# Patient Record
Sex: Male | Born: 1955 | Race: White | Hispanic: No | Marital: Married | Smoking: Former smoker
Health system: Southern US, Community
[De-identification: ages and names within clinical notes are randomized; demographics above are authoritative.]

## PROBLEM LIST (undated history)

## (undated) DIAGNOSIS — C801 Malignant (primary) neoplasm, unspecified: Secondary | ICD-10-CM

## (undated) DIAGNOSIS — H9190 Unspecified hearing loss, unspecified ear: Secondary | ICD-10-CM

## (undated) DIAGNOSIS — R7303 Prediabetes: Secondary | ICD-10-CM

## (undated) DIAGNOSIS — M169 Osteoarthritis of hip, unspecified: Secondary | ICD-10-CM

## (undated) DIAGNOSIS — M109 Gout, unspecified: Secondary | ICD-10-CM

## (undated) HISTORY — DX: Malignant (primary) neoplasm, unspecified: C80.1

## (undated) HISTORY — DX: Unspecified hearing loss, unspecified ear: H91.90

## (undated) HISTORY — DX: Gout, unspecified: M10.9

## (undated) HISTORY — PX: INNER EAR SURGERY: SHX679

## (undated) HISTORY — PX: CATARACT EXTRACTION: SUR2

---

## 2004-02-08 ENCOUNTER — Ambulatory Visit (HOSPITAL_COMMUNITY): Admission: RE | Admit: 2004-02-08 | Discharge: 2004-02-08 | Payer: Self-pay | Admitting: Orthopedic Surgery

## 2005-06-30 HISTORY — PX: OTHER SURGICAL HISTORY: SHX169

## 2006-08-03 IMAGING — CR DG ORBITS FOR FOREIGN BODY
2 series · 2 of 2 positions shown · non-contrast
Comparison: none

CLINICAL DATA: foreign body
 ORBITS FOR FOREIGN BODY (TWO VIEWS)
 There is no evidence of metal or radiopaque foreign body within the orbits. No significant bone or soft tissue abnormalities are identified.

 IMPRESSION
 No evidence of metallic foreign body.

[view not recorded (1 of 2)]
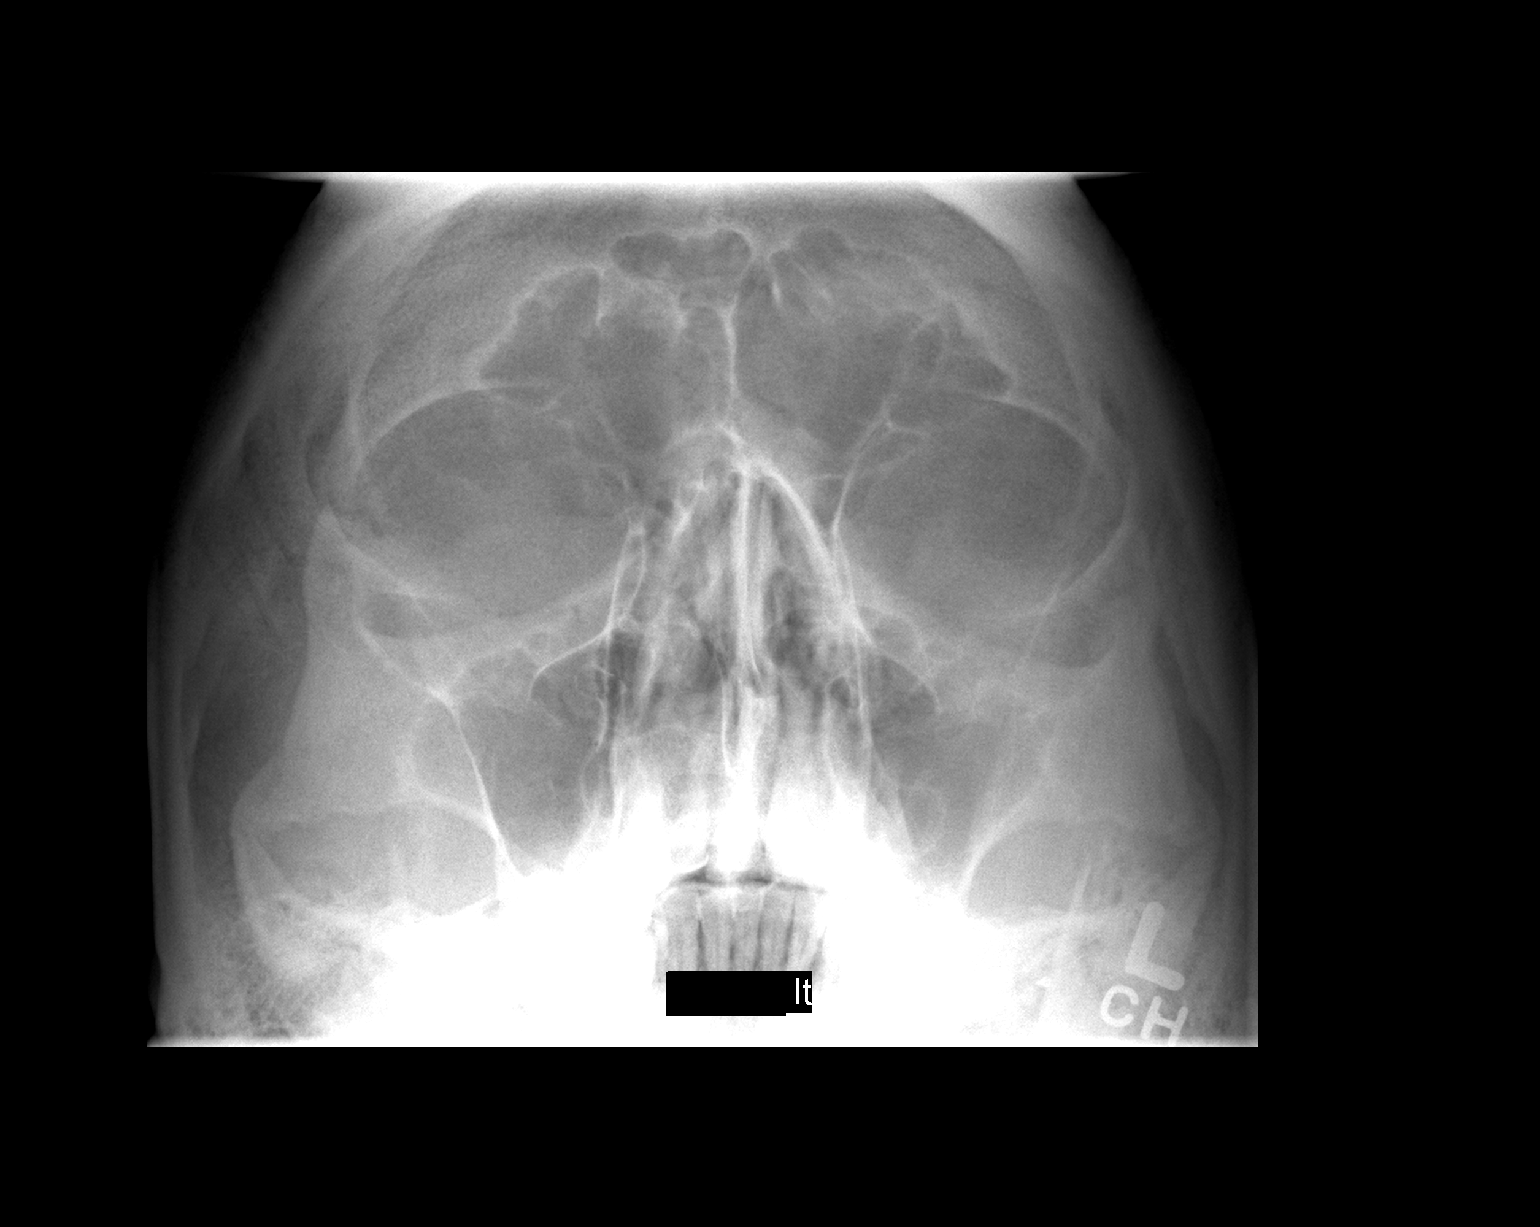

[view not recorded (2 of 2)]
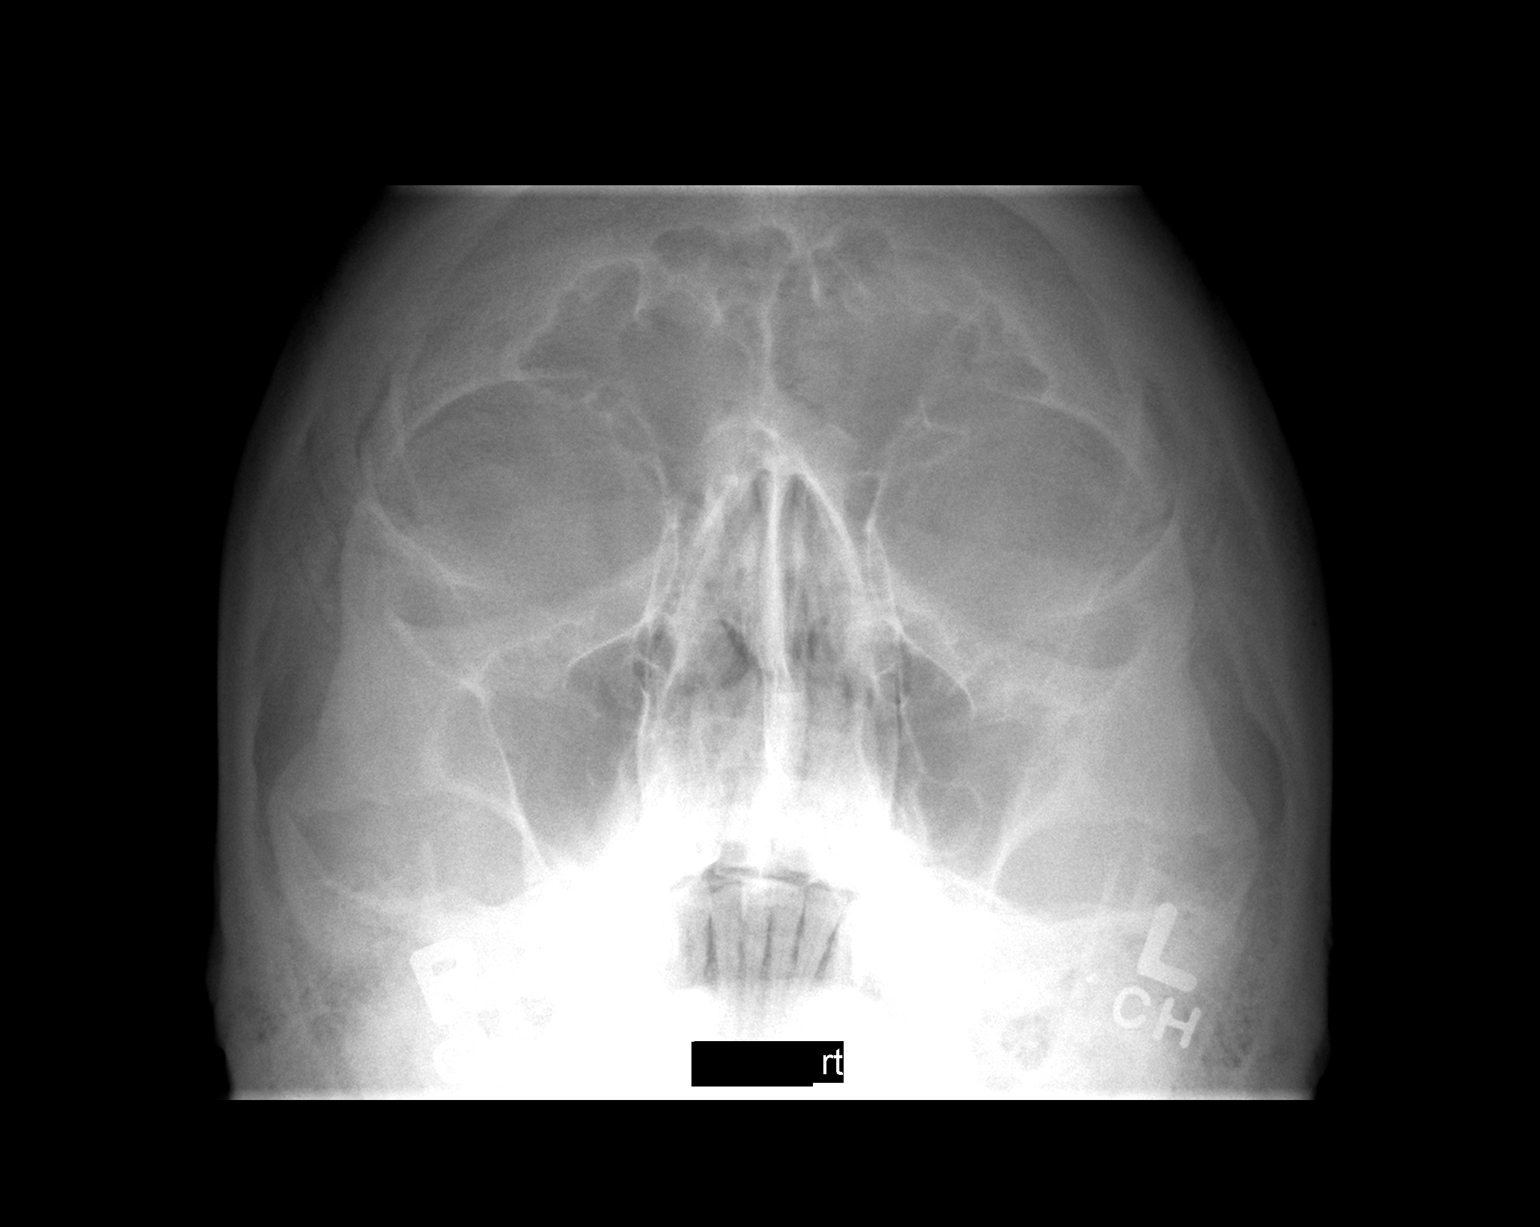

[2 of 2 positions shown; findings below may reference images not displayed]

## 2012-10-13 ENCOUNTER — Ambulatory Visit (INDEPENDENT_AMBULATORY_CARE_PROVIDER_SITE_OTHER): Payer: BC Managed Care – PPO | Admitting: *Deleted

## 2012-10-13 ENCOUNTER — Encounter: Payer: Self-pay | Admitting: *Deleted

## 2012-10-13 ENCOUNTER — Encounter: Payer: Self-pay | Admitting: Diagnostic Neuroimaging

## 2012-10-13 DIAGNOSIS — R200 Anesthesia of skin: Secondary | ICD-10-CM

## 2012-10-13 DIAGNOSIS — E538 Deficiency of other specified B group vitamins: Secondary | ICD-10-CM

## 2012-10-13 MED ORDER — CYANOCOBALAMIN 1000 MCG/ML IJ SOLN
1000.0000 ug | INTRAMUSCULAR | Status: AC
  Administered 2012-10-13 – 2013-03-02 (×5): 1000 ug via INTRAMUSCULAR

## 2012-10-13 NOTE — Progress Notes (Signed)
Pt here for B 12 injection.  Under aseptic technique cyanocobalamin 1000mcg/1ml IM given L deltoid.  Tolerated well.  Bandaid applied.  

## 2012-10-13 NOTE — Patient Instructions (Signed)
See pt next month. (5/14-14) .

## 2012-11-10 ENCOUNTER — Ambulatory Visit: Payer: BC Managed Care – PPO

## 2012-12-08 ENCOUNTER — Ambulatory Visit (INDEPENDENT_AMBULATORY_CARE_PROVIDER_SITE_OTHER): Payer: BC Managed Care – PPO | Admitting: *Deleted

## 2012-12-08 DIAGNOSIS — E538 Deficiency of other specified B group vitamins: Secondary | ICD-10-CM

## 2012-12-08 NOTE — Progress Notes (Signed)
Pt here for B 12 injection.  Under aseptic technique cyanocobalamin 1000mcg/1ml IM given L deltoid.  Tolerated well.  Bandaid applied.  

## 2012-12-08 NOTE — Patient Instructions (Signed)
Pt to return 01-05-13

## 2013-01-05 ENCOUNTER — Ambulatory Visit (INDEPENDENT_AMBULATORY_CARE_PROVIDER_SITE_OTHER): Payer: BC Managed Care – PPO | Admitting: *Deleted

## 2013-01-05 DIAGNOSIS — E538 Deficiency of other specified B group vitamins: Secondary | ICD-10-CM

## 2013-01-05 NOTE — Progress Notes (Signed)
Pt here for B 12 injection.  Under aseptic technique cyanocobalamin 1000mcg/1ml IM given L deltoid.  Tolerated well.  Bandaid applied.  

## 2013-01-05 NOTE — Patient Instructions (Addendum)
Will see next week 

## 2013-02-02 ENCOUNTER — Ambulatory Visit (INDEPENDENT_AMBULATORY_CARE_PROVIDER_SITE_OTHER): Payer: BC Managed Care – PPO

## 2013-02-02 DIAGNOSIS — E538 Deficiency of other specified B group vitamins: Secondary | ICD-10-CM

## 2013-02-02 NOTE — Patient Instructions (Signed)
Return in one month.  

## 2013-02-02 NOTE — Progress Notes (Signed)
Administration of Cyanocobalamin 1065mcg/1ml injection under aseptic technique in R deltoid. Tolerated well.

## 2013-03-02 ENCOUNTER — Ambulatory Visit (INDEPENDENT_AMBULATORY_CARE_PROVIDER_SITE_OTHER): Payer: BC Managed Care – PPO | Admitting: *Deleted

## 2013-03-02 DIAGNOSIS — E538 Deficiency of other specified B group vitamins: Secondary | ICD-10-CM

## 2013-03-02 NOTE — Patient Instructions (Signed)
See next month. 

## 2013-03-02 NOTE — Progress Notes (Signed)
Pt here for B 12 injection.  Under aseptic technique cyanocobalamin 1000mcg/1ml IM given R deltoid.  Tolerated well.  Bandaid applied.  

## 2013-03-30 ENCOUNTER — Ambulatory Visit (INDEPENDENT_AMBULATORY_CARE_PROVIDER_SITE_OTHER): Payer: BC Managed Care – PPO | Admitting: Neurology

## 2013-03-30 DIAGNOSIS — E538 Deficiency of other specified B group vitamins: Secondary | ICD-10-CM

## 2013-03-30 MED ORDER — CYANOCOBALAMIN 1000 MCG/ML IJ SOLN
1000.0000 ug | Freq: Once | INTRAMUSCULAR | Status: AC
  Administered 2013-03-30: 1000 ug via INTRAMUSCULAR

## 2013-03-30 NOTE — Patient Instructions (Signed)
Patient will return next month for B12 injection. 

## 2013-03-30 NOTE — Progress Notes (Signed)
Patient here for B12 injection.  Under aseptic technique Cyanocobalamin 1000mcg/1ml given IM in left deltoid.  Tolerated well.  Band-Aid applied. 

## 2013-04-01 ENCOUNTER — Ambulatory Visit: Payer: BC Managed Care – PPO

## 2013-04-27 ENCOUNTER — Ambulatory Visit (INDEPENDENT_AMBULATORY_CARE_PROVIDER_SITE_OTHER): Payer: BC Managed Care – PPO | Admitting: *Deleted

## 2013-04-27 DIAGNOSIS — E538 Deficiency of other specified B group vitamins: Secondary | ICD-10-CM

## 2013-04-27 MED ORDER — CYANOCOBALAMIN 1000 MCG/ML IJ SOLN
1000.0000 ug | INTRAMUSCULAR | Status: AC
  Administered 2013-04-27 – 2013-09-07 (×6): 1000 ug via INTRAMUSCULAR

## 2013-04-27 NOTE — Progress Notes (Signed)
Pt here for B 12 injection.  Under aseptic technique cyanocobalamin 1000mcg/1ml IM given R deltoid.  Tolerated well.  Bandaid applied.  

## 2013-04-27 NOTE — Patient Instructions (Signed)
See next week

## 2013-05-02 ENCOUNTER — Other Ambulatory Visit: Payer: Self-pay | Admitting: Diagnostic Neuroimaging

## 2013-05-06 ENCOUNTER — Other Ambulatory Visit: Payer: Self-pay

## 2013-05-06 MED ORDER — GABAPENTIN 300 MG PO CAPS: 300.0000 mg | ORAL_CAPSULE | Freq: Three times a day (TID) | ORAL | Status: DC

## 2013-05-25 ENCOUNTER — Ambulatory Visit (INDEPENDENT_AMBULATORY_CARE_PROVIDER_SITE_OTHER): Payer: BC Managed Care – PPO | Admitting: *Deleted

## 2013-05-25 DIAGNOSIS — E538 Deficiency of other specified B group vitamins: Secondary | ICD-10-CM

## 2013-05-25 NOTE — Progress Notes (Signed)
Pt here for B 12 injection.  Under aseptic technique cyanocobalamin 1000mcg/1ml IM given R deltoid.  Tolerated well.  Bandaid applied.  

## 2013-05-25 NOTE — Patient Instructions (Signed)
See next month.   Has appt with Dr. Marjory Lies next Monday.

## 2013-05-30 ENCOUNTER — Encounter: Payer: Self-pay | Admitting: Diagnostic Neuroimaging

## 2013-05-30 ENCOUNTER — Ambulatory Visit (INDEPENDENT_AMBULATORY_CARE_PROVIDER_SITE_OTHER): Payer: BC Managed Care – PPO | Admitting: Diagnostic Neuroimaging

## 2013-05-30 VITALS — BP 126/74 | HR 60 | Ht 77.0 in | Wt 258.0 lb

## 2013-05-30 DIAGNOSIS — E538 Deficiency of other specified B group vitamins: Secondary | ICD-10-CM

## 2013-05-30 DIAGNOSIS — G63 Polyneuropathy in diseases classified elsewhere: Secondary | ICD-10-CM

## 2013-05-30 MED ORDER — GABAPENTIN 300 MG PO CAPS: 300.0000 mg | ORAL_CAPSULE | Freq: Two times a day (BID) | ORAL | Status: DC

## 2013-05-30 NOTE — Addendum Note (Signed)
Addended byJoycelyn Schmid on: 05/30/2013 09:25 AM   Modules accepted: Orders

## 2013-05-30 NOTE — Patient Instructions (Signed)
Continue gabapentin and B12 replacement injections.

## 2013-05-30 NOTE — Progress Notes (Signed)
[facility]  PATIENT: Stephen Rodgers DOB: 1955/07/02  REFERRING CLINICIAN:  HISTORY FROM: patient REASON FOR VISIT: follow up   HISTORICAL  CHIEF COMPLAINT:  Chief Complaint  Patient presents with  . Follow-up    numbness-feet    HISTORY OF PRESENT ILLNESS:   UPDATE 05/30/13: Since last visit patient is doing well. He feels that B12 injections are helping his numbness in his feet. He also takes gabapentin 300 mg twice a day. Patient reports missing some vitamin B12 injections (when he was out of town, do to the death of both of his brothers), and at that time his numbness increased.  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 He has also had left ear surgery attempted for repair in 1999 with a metal pin placed. He is here today to be evaluated for numbness to the balls and toes of both feet for the last 6-8 months both started at the same time. He describes discomfort as prickly It is present at least 90% of the time worse in the mornings when he wakens and at night after working. He states it feels like they are asleep. Reports balance issues upon awakening takes him about our 10 seconds before he takes a step. 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 He has had a back injury when he was 57 years old which needs realignment every now and then. Denies any sharp shooting pains from his back. He reports he eats a lot of her any. He is supposed to take vitamin B12 tablets for a vitamin B12 level of 224 done on 10/13/11 which he has not started. Fasting blood sugar was 113 on 10/13/11. He primarily just wants to know why he is having numbness.  REVIEW OF SYSTEMS: Full 14 system review of systems performed and notable only for hearing loss, numbness. Otherwise  negative.  ALLERGIES: No Known Allergies  HOME MEDICATIONS: Outpatient Prescriptions Prior to Visit  Medication Sig Dispense Refill  . allopurinol (ZYLOPRIM) 300 MG tablet Take 300 mg by mouth daily.      . cyanocobalamin (,VITAMIN B-12,) 1000 MCG/ML injection Inject 1,000 mcg into the muscle every 30 (thirty) days.      Marland Kitchen ibuprofen (ADVIL,MOTRIN) 800 MG tablet Take 800 mg by mouth every 8 (eight) hours as needed for pain.      Marland Kitchen gabapentin (NEURONTIN) 300 MG capsule TAKE 1 CAPSULE BY MOUTH EVERY DAY AT BEDTIME INCREASE TO 1 CAPSULE BY MOUTH THREE TIMES DAILY AS TOLERATED  90 capsule  3   Facility-Administered Medications Prior to Visit  Medication Dose Route Frequency Provider Last Rate Last Dose  . cyanocobalamin ((VITAMIN B-12)) injection 1,000 mcg  1,000 mcg Intramuscular Q30 days Suanne Marker, MD   1,000 mcg at 05/25/13 0840    PAST MEDICAL HISTORY: Past Medical History  Diagnosis Date  . Hearing loss   . Gout   . Cancer     PAST SURGICAL HISTORY: Past Surgical History  Procedure Laterality Date  . Other surgical history Left 2007    L eye cancer and reconstruct    FAMILY HISTORY: Family History  Problem Relation Age of Onset  . Hypertension Mother     Died in MVA  17  . Other Mother     Biomedical scientist  . Colon  cancer Father     PD, Triple bypass, died 46  . Stomach cancer Maternal Grandmother   . Lung cancer Maternal Grandfather   . COPD Brother     Died Nov 13, 2012    SOCIAL HISTORY:  History   Social History  . Marital Status: Married    Spouse Name: Tamie    Number of Children: 3  . Years of Education: College   Occupational History  .      [employer]   Social History Main Topics  . Smoking status: Former Smoker    Quit date: 11/13/2003  . Smokeless tobacco: Current User    Types: Chew  . Alcohol Use: 0.0 oz/week     Comment: beer occasionally  . Drug Use: No  . Sexual Activity: Not on file   Other Topics Concern  . Not on file    Social History Narrative   Patient lives at home with spouse.   Caffeine Use: 2 cups daily     PHYSICAL EXAM  Filed Vitals:   05/30/13 0843  BP: 126/74  Pulse: 60  Height: 6\' 5"  (1.956 m)  Weight: 258 lb (117.028 kg)    Not recorded    Body mass index is 30.59 kg/(m^2).  GENERAL EXAM: Patient is in no distress; LEFT LOWER EYELID POST-SURGICAL.  CARDIOVASCULAR: Regular rate and rhythm, no murmurs, no carotid bruits  NEUROLOGIC: MENTAL STATUS: awake, alert, language fluent, comprehension intact, naming intact CRANIAL NERVE: pupils equal and reactive to light, visual fields full to confrontation, extraocular muscles intact, no nystagmus, facial sensation and strength symmetric, uvula midline, shoulder shrug symmetric, tongue midline. MOTOR: normal bulk and tone, full strength in the BUE, BLE SENSORY: DECR IN FEET TO PP, LT, TEMP. ABSENT VIB AT TOES. COORDINATION: finger-nose-finger, fine finger movements normal REFLEXES: deep tendon reflexes present and symmetric; TRACE AT ANKLES. GAIT/STATION: narrow based gait; able to walk on toes, heels and tandem; romberg is negative   DIAGNOSTIC DATA (LABS, IMAGING, TESTING) - I reviewed patient records, labs, notes, testing and imaging myself where available.  No results found for this basename: WBC, HGB, HCT, MCV, PLT   No results found for this basename: na, k, cl, co2, glucose, bun, creatinine, calcium, prot, albumin, ast, alt, alkphos, bilitot, gfrnonaa, gfraa   No results found for this basename: CHOL, HDL, LDLCALC, LDLDIRECT, TRIG, CHOLHDL   No results found for this basename: HGBA1C   No results found for this basename: VITAMINB12   No results found for this basename: TSH   10/11/11 Labs B12 224  10/25/11 Labs A1C 5.2 TSH 2.080 Anti-parietal - 5.9 Anti-IF - POSITIVE Homocysteine 10.6 MMA 135  11/04/11 EMG/NCS 1. Widespread, length-dependent axonal sensorimotor polyneuropathy. 2. 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   ASSESSMENT AND PLAN  57 y.o. 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 Patient is doing well on B12 injection replacement therapy and gabapentin.  PLAN: - continue monthly B12 injections - continue gabapentin 300mg  BID   Meds ordered this encounter  Medications  . gabapentin (NEURONTIN) 300 MG capsule    Sig: Take 1 capsule (300 mg total) by mouth 2 (two) times daily.    Dispense:  180 capsule    Refill:  4    Pt states takes bid   Return in about 1 year (around 05/30/2014) for with Edison Nasuti, MD 05/30/2013, 9:08 AM Certified in Neurology, Neurophysiology and Neuroimaging  [facility]  [address] [place], [place] [address] [phone]

## 2013-06-21 ENCOUNTER — Ambulatory Visit (INDEPENDENT_AMBULATORY_CARE_PROVIDER_SITE_OTHER): Payer: BC Managed Care – PPO

## 2013-06-21 DIAGNOSIS — E538 Deficiency of other specified B group vitamins: Secondary | ICD-10-CM

## 2013-06-21 NOTE — Progress Notes (Signed)
Cyanocobalamin (B 12) 1000 mcg injected in left deltoid under aseptic technique. Tolerated well.

## 2013-06-21 NOTE — Patient Instructions (Signed)
Return in one month.  

## 2013-07-19 ENCOUNTER — Encounter (INDEPENDENT_AMBULATORY_CARE_PROVIDER_SITE_OTHER): Payer: Self-pay

## 2013-07-19 ENCOUNTER — Ambulatory Visit (INDEPENDENT_AMBULATORY_CARE_PROVIDER_SITE_OTHER): Payer: BC Managed Care – PPO | Admitting: *Deleted

## 2013-07-19 DIAGNOSIS — E538 Deficiency of other specified B group vitamins: Secondary | ICD-10-CM

## 2013-07-19 DIAGNOSIS — G63 Polyneuropathy in diseases classified elsewhere: Secondary | ICD-10-CM

## 2013-07-19 NOTE — Progress Notes (Signed)
Pt here for B 12 injection.  Under aseptic technique cyanocobalamin 1000mcg/1ml IM given R deltoid.  Tolerated well.  Bandaid applied.  

## 2013-07-19 NOTE — Patient Instructions (Signed)
See next month (3 wks per pt since his work schedule).

## 2013-08-10 ENCOUNTER — Ambulatory Visit (INDEPENDENT_AMBULATORY_CARE_PROVIDER_SITE_OTHER): Payer: BC Managed Care – PPO | Admitting: *Deleted

## 2013-08-10 DIAGNOSIS — Z0289 Encounter for other administrative examinations: Secondary | ICD-10-CM

## 2013-08-10 DIAGNOSIS — E538 Deficiency of other specified B group vitamins: Secondary | ICD-10-CM

## 2013-08-10 DIAGNOSIS — G63 Polyneuropathy in diseases classified elsewhere: Secondary | ICD-10-CM

## 2013-08-10 NOTE — Patient Instructions (Signed)
See next month. 

## 2013-08-10 NOTE — Progress Notes (Signed)
Pt here for B 12 injection.  Under aseptic technique cyanocobalamin 1000mcg/1ml IM given R deltoid.  Tolerated well.  Bandaid applied.  

## 2013-09-07 ENCOUNTER — Ambulatory Visit (INDEPENDENT_AMBULATORY_CARE_PROVIDER_SITE_OTHER): Payer: BC Managed Care – PPO | Admitting: *Deleted

## 2013-09-07 DIAGNOSIS — E538 Deficiency of other specified B group vitamins: Secondary | ICD-10-CM

## 2013-09-07 DIAGNOSIS — G63 Polyneuropathy in diseases classified elsewhere: Principal | ICD-10-CM

## 2013-09-07 NOTE — Patient Instructions (Signed)
Will see next month.

## 2013-09-07 NOTE — Progress Notes (Signed)
Pt here for B 12 injection.  Under aseptic technique cyanocobalamin 1000mcg/1ml IM given R deltoid.  Tolerated well.  Bandaid applied.  

## 2013-10-05 ENCOUNTER — Ambulatory Visit (INDEPENDENT_AMBULATORY_CARE_PROVIDER_SITE_OTHER): Payer: BC Managed Care – PPO | Admitting: *Deleted

## 2013-10-05 DIAGNOSIS — E538 Deficiency of other specified B group vitamins: Secondary | ICD-10-CM

## 2013-10-05 MED ORDER — CYANOCOBALAMIN 1000 MCG/ML IJ SOLN
1000.0000 ug | INTRAMUSCULAR | Status: AC
  Administered 2013-10-05 – 2014-01-25 (×5): 1000 ug via INTRAMUSCULAR

## 2013-10-05 NOTE — Progress Notes (Signed)
Pt here for B 12 injection.  Under aseptic technique cyanocobalamin 1000mcg/1ml IM given R deltoid.  Tolerated well.  Bandaid applied.  

## 2013-10-05 NOTE — Patient Instructions (Signed)
See next month. 

## 2013-11-02 ENCOUNTER — Ambulatory Visit (INDEPENDENT_AMBULATORY_CARE_PROVIDER_SITE_OTHER): Payer: BC Managed Care – PPO | Admitting: *Deleted

## 2013-11-02 DIAGNOSIS — G63 Polyneuropathy in diseases classified elsewhere: Secondary | ICD-10-CM

## 2013-11-02 DIAGNOSIS — E538 Deficiency of other specified B group vitamins: Secondary | ICD-10-CM

## 2013-11-02 NOTE — Progress Notes (Signed)
Pt here for B 12 injection.  Under aseptic technique cyanocobalamin 1000mcg/1ml IM given R deltoid.  Tolerated well.  Bandaid applied.  

## 2013-11-02 NOTE — Patient Instructions (Signed)
See next month. 

## 2013-11-30 ENCOUNTER — Ambulatory Visit (INDEPENDENT_AMBULATORY_CARE_PROVIDER_SITE_OTHER): Payer: BC Managed Care – PPO | Admitting: *Deleted

## 2013-11-30 DIAGNOSIS — E538 Deficiency of other specified B group vitamins: Secondary | ICD-10-CM

## 2013-11-30 NOTE — Patient Instructions (Addendum)
See pt next week for # 4 of weekly injections.

## 2013-11-30 NOTE — Progress Notes (Signed)
Pt here for B 12 injection.  Under aseptic technique cyanocobalamin 1000mcg/1ml IM given R deltoid.  Tolerated well.  Bandaid applied.  

## 2013-12-28 ENCOUNTER — Ambulatory Visit (INDEPENDENT_AMBULATORY_CARE_PROVIDER_SITE_OTHER): Payer: BC Managed Care – PPO | Admitting: *Deleted

## 2013-12-28 DIAGNOSIS — E538 Deficiency of other specified B group vitamins: Secondary | ICD-10-CM

## 2013-12-28 DIAGNOSIS — G63 Polyneuropathy in diseases classified elsewhere: Principal | ICD-10-CM

## 2013-12-28 NOTE — Progress Notes (Signed)
Pt here for B 12 injection.  Under aseptic technique cyanocobalamin 1000mcg/1ml IM given R deltoid.  Tolerated well.  Bandaid applied.  

## 2013-12-28 NOTE — Patient Instructions (Signed)
See next month. 

## 2014-01-25 ENCOUNTER — Ambulatory Visit (INDEPENDENT_AMBULATORY_CARE_PROVIDER_SITE_OTHER): Payer: BC Managed Care – PPO

## 2014-01-25 DIAGNOSIS — G63 Polyneuropathy in diseases classified elsewhere: Principal | ICD-10-CM

## 2014-01-25 DIAGNOSIS — E538 Deficiency of other specified B group vitamins: Secondary | ICD-10-CM

## 2014-01-25 NOTE — Patient Instructions (Signed)
See patient next month.

## 2014-01-25 NOTE — Progress Notes (Signed)
Pt here for B 12 injection. Under aseptic technique cyanocobalamin 1000mcg/1ml IM given L deltoid. Tolerated well. 

## 2014-02-22 ENCOUNTER — Ambulatory Visit (INDEPENDENT_AMBULATORY_CARE_PROVIDER_SITE_OTHER): Payer: BC Managed Care – PPO | Admitting: *Deleted

## 2014-02-22 DIAGNOSIS — E538 Deficiency of other specified B group vitamins: Secondary | ICD-10-CM

## 2014-02-22 MED ORDER — CYANOCOBALAMIN 1000 MCG/ML IJ SOLN: 1000.0000 ug | INTRAMUSCULAR | Status: DC

## 2014-02-22 MED ORDER — CYANOCOBALAMIN 1000 MCG/ML IJ SOLN
1000.0000 ug | INTRAMUSCULAR | Status: AC
  Administered 2014-02-22 – 2014-06-14 (×5): 1000 ug via INTRAMUSCULAR

## 2014-02-22 NOTE — Progress Notes (Signed)
Patient here for B 12 injection, cyanocobalamin 1000 mcg/ mL injected into the Left deltoid, band aid applied, patient tolerated well.

## 2014-02-22 NOTE — Patient Instructions (Signed)
Patient will return in one month for next injection. 

## 2014-03-22 ENCOUNTER — Ambulatory Visit (INDEPENDENT_AMBULATORY_CARE_PROVIDER_SITE_OTHER): Payer: BC Managed Care – PPO

## 2014-03-22 DIAGNOSIS — E538 Deficiency of other specified B group vitamins: Secondary | ICD-10-CM

## 2014-03-22 NOTE — Progress Notes (Signed)
Patient here for B 12 injection, cyanocobalamin 1000 mcg/ mL injected into the R deltoid, band aid applied, patient tolerated well.

## 2014-03-22 NOTE — Patient Instructions (Signed)
Return in 4 weeks

## 2014-04-19 ENCOUNTER — Ambulatory Visit (INDEPENDENT_AMBULATORY_CARE_PROVIDER_SITE_OTHER): Payer: BC Managed Care – PPO | Admitting: *Deleted

## 2014-04-19 DIAGNOSIS — G63 Polyneuropathy in diseases classified elsewhere: Principal | ICD-10-CM

## 2014-04-19 DIAGNOSIS — E538 Deficiency of other specified B group vitamins: Secondary | ICD-10-CM

## 2014-04-19 NOTE — Patient Instructions (Signed)
Patient will return in one month for next injection. 

## 2014-04-19 NOTE — Progress Notes (Signed)
Patient here for B 12 injection, cyanocobalamin 1000 mcg/ mL injected into the R deltoid, band aid applied, patient tolerated well.

## 2014-05-10 ENCOUNTER — Telehealth: Payer: Self-pay | Admitting: Diagnostic Neuroimaging

## 2014-05-10 NOTE — Telephone Encounter (Signed)
Left message for patient regarding rescheduling 05/30/14 per [place] leaving.

## 2014-05-17 ENCOUNTER — Ambulatory Visit (INDEPENDENT_AMBULATORY_CARE_PROVIDER_SITE_OTHER): Payer: BC Managed Care – PPO | Admitting: *Deleted

## 2014-05-17 DIAGNOSIS — E538 Deficiency of other specified B group vitamins: Secondary | ICD-10-CM

## 2014-05-17 DIAGNOSIS — G63 Polyneuropathy in diseases classified elsewhere: Principal | ICD-10-CM

## 2014-05-17 NOTE — Patient Instructions (Signed)
See next month for injection.  See's Dr. Leta Baptist 06-01-14.

## 2014-05-17 NOTE — Progress Notes (Signed)
Pt here for B 12 injection.  Under aseptic technique cyanocobalamin 1000mcg/1ml IM given L deltoid.  Tolerated well.  Bandaid applied.  

## 2014-05-30 ENCOUNTER — Ambulatory Visit: Payer: BC Managed Care – PPO | Admitting: Nurse Practitioner

## 2014-06-01 ENCOUNTER — Ambulatory Visit: Payer: BC Managed Care – PPO | Admitting: Diagnostic Neuroimaging

## 2014-06-02 ENCOUNTER — Ambulatory Visit (INDEPENDENT_AMBULATORY_CARE_PROVIDER_SITE_OTHER): Payer: BC Managed Care – PPO | Admitting: Diagnostic Neuroimaging

## 2014-06-02 ENCOUNTER — Encounter: Payer: Self-pay | Admitting: Diagnostic Neuroimaging

## 2014-06-02 VITALS — BP 112/71 | HR 60 | Temp 97.5°F | Ht 77.0 in | Wt 266.0 lb

## 2014-06-02 DIAGNOSIS — E538 Deficiency of other specified B group vitamins: Secondary | ICD-10-CM

## 2014-06-02 DIAGNOSIS — G63 Polyneuropathy in diseases classified elsewhere: Principal | ICD-10-CM

## 2014-06-02 NOTE — Addendum Note (Signed)
Addended by: Milta Deiters on: 06/02/2014 10:16 AM   Modules accepted: Medications

## 2014-06-02 NOTE — Progress Notes (Signed)
[facility]  PATIENT: Stephen Rodgers DOB: 11/28/1955  REFERRING CLINICIAN:  HISTORY FROM: patient REASON FOR VISIT: follow up   HISTORICAL  CHIEF COMPLAINT:  No chief complaint on file.   HISTORY OF PRESENT ILLNESS:   UPDATE 06/02/14: Since last visit, sxs are stable. On B12 injections. Workign 14hrs per day x 3 months, and some more plantar foot pain, diff than prior neuropathy numbness.  UPDATE 05/30/13: Since last visit patient is doing well. He feels that B12 injections are helping his numbness in his feet. He also takes gabapentin 300 mg twice a day. Patient reports missing some vitamin B12 injections (when he was out of town, do to the death of both of his brothers), and at that time his numbness increased.  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 He has also had left ear surgery attempted for repair in 1999 with a metal pin placed. He is here today to be evaluated for numbness to the balls and toes of both feet for the last 6-8 months both started at the same time. He describes discomfort as prickly It is present at least 90% of the time worse in the mornings when he wakens and at night after working. He states it feels like they are asleep. Reports balance issues upon awakening takes him about our 10 seconds before he takes a step. 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 He has had a back injury when he was 58 years old which needs realignment every now and then. Denies any sharp shooting pains from his back. He reports he eats a lot of her any. He is supposed to take vitamin B12 tablets for a vitamin B12 level of 224 done on 10/13/11 which he has not started. Fasting blood sugar was 113 on 10/13/11. He primarily just wants to know why he is having  numbness.  REVIEW OF SYSTEMS: Full 14 system review of systems performed and notable only for ear discharge.   ALLERGIES: No Known Allergies  HOME MEDICATIONS: Outpatient Prescriptions Prior to Visit  Medication Sig Dispense Refill  . allopurinol (ZYLOPRIM) 300 MG tablet Take 300 mg by mouth daily.    Marland Kitchen gabapentin (NEURONTIN) 300 MG capsule Take 1 capsule (300 mg total) by mouth 2 (two) times daily. 180 capsule 4  . ibuprofen (ADVIL,MOTRIN) 800 MG tablet Take 800 mg by mouth every 8 (eight) hours as needed for pain.     Facility-Administered Medications Prior to Visit  Medication Dose Route Frequency Provider Last Rate Last Dose  . cyanocobalamin ((VITAMIN B-12)) injection 1,000 mcg  1,000 mcg Intramuscular Q30 days Penni Bombard, MD   1,000 mcg at 05/17/14 7035    PAST MEDICAL HISTORY: Past Medical History  Diagnosis Date  . Hearing loss   . Gout   . Cancer     PAST SURGICAL HISTORY: Past Surgical History  Procedure Laterality Date  . Other surgical history Left 2007    L eye cancer and reconstruct    FAMILY HISTORY: Family History  Problem Relation Age of Onset  . Hypertension Mother     Died in [place]  . Other Mother     Facilities manager  . Colon cancer Father     PD, Triple bypass, died 58  . Stomach cancer Maternal Grandmother   .  Lung cancer Maternal Grandfather   . COPD Brother     Died 2014    SOCIAL HISTORY:  History   Social History  . Marital Status: Married    Spouse Name: Tamie    Number of Children: 3  . Years of Education: College   Occupational History  .      [employer]   Social History Main Topics  . Smoking status: Former Smoker    Quit date: 11/13/2003  . Smokeless tobacco: Current User    Types: Chew  . Alcohol Use: 0.0 oz/week     Comment: beer occasionally  . Drug Use: No  . Sexual Activity: Not on file   Other Topics Concern  . Not on file   Social History Narrative   Patient lives at home with spouse.    Caffeine Use: 2 cups daily     PHYSICAL EXAM  There were no vitals filed for this visit.  Not recorded      There is no weight on file to calculate BMI.  GENERAL EXAM: Patient is in no distress; LEFT LOWER EYELID POST-SURGICAL.  CARDIOVASCULAR: Regular rate and rhythm, no murmurs, no carotid bruits  NEUROLOGIC: MENTAL STATUS: awake, alert, language fluent, comprehension intact, naming intact CRANIAL NERVE: pupils equal and reactive to light, visual fields full to confrontation, extraocular muscles intact, no nystagmus, facial sensation and strength symmetric, uvula midline, shoulder shrug symmetric, tongue midline. MOTOR: normal bulk and tone, full strength in the BUE, BLE SENSORY: DECR IN FEET TO PP, LT, TEMP. ABSENT VIB AT TOES. COORDINATION: finger-nose-finger, fine finger movements normal REFLEXES: deep tendon reflexes present and symmetric; TRACE AT ANKLES. GAIT/STATION: narrow based gait; able to walk on toes, heels; romberg is negative   DIAGNOSTIC DATA (LABS, IMAGING, TESTING) - I reviewed patient records, labs, notes, testing and imaging myself where available.  No results found for: WBC No results found for: NA No results found for: CHOL No results found for: HGBA1C No results found for: VITAMINB12 No results found for: TSH    10/25/11 A1C 5.2, TSH 2.080  10/11/11 B12 - 224 (L)  10/25/11 Anti-parietal - 5.9, Anti-IF - POSITIVE, Homocysteine 10.6, MMA 135  11/04/11 EMG/NCS 1. Widespread, length-dependent axonal sensorimotor polyneuropathy. 2. Evidence of chronic denervation in the right gastrocnemius muscle.   ASSESSMENT AND PLAN  58 y.o. 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 Patient is stable on B12 injection replacement therapy and gabapentin.  PLAN: - continue monthly B12 injections; will check B12 level today - continue gabapentin 300mg  BID  Return in about 6 months (around  12/02/2014).   Penni Bombard, MD 95/11/3873, 6:43 AM Certified in Neurology, Neurophysiology and Neuroimaging  [facility] [address] [place], [place] [address] [phone]

## 2014-06-03 LAB — VITAMIN B12: VITAMIN B 12: 483 pg/mL (ref 211–946)

## 2014-06-09 ENCOUNTER — Other Ambulatory Visit: Payer: Self-pay | Admitting: Diagnostic Neuroimaging

## 2014-06-14 ENCOUNTER — Ambulatory Visit (INDEPENDENT_AMBULATORY_CARE_PROVIDER_SITE_OTHER): Payer: BC Managed Care – PPO | Admitting: *Deleted

## 2014-06-14 DIAGNOSIS — G63 Polyneuropathy in diseases classified elsewhere: Principal | ICD-10-CM

## 2014-06-14 DIAGNOSIS — E538 Deficiency of other specified B group vitamins: Secondary | ICD-10-CM

## 2014-06-14 NOTE — Patient Instructions (Signed)
Patient will return in 4 weeks for next injection.

## 2014-06-14 NOTE — Progress Notes (Signed)
Patient here for B 12 injection. Under aseptic technique cyanocobalamin 1066mcg/1ml IM given right deltoid , patient tolerated well. Bandaid applied.

## 2014-07-12 ENCOUNTER — Ambulatory Visit (INDEPENDENT_AMBULATORY_CARE_PROVIDER_SITE_OTHER): Payer: BLUE CROSS/BLUE SHIELD | Admitting: Neurology

## 2014-07-12 DIAGNOSIS — E538 Deficiency of other specified B group vitamins: Secondary | ICD-10-CM

## 2014-07-12 MED ORDER — CYANOCOBALAMIN 1000 MCG/ML IJ SOLN
1000.0000 ug | Freq: Once | INTRAMUSCULAR | Status: AC
  Administered 2014-07-12: 1000 ug via INTRAMUSCULAR

## 2014-07-12 NOTE — Progress Notes (Signed)
Patient here for B12 injection.  Under aseptic technique Cyanocobalamin 1074mcg/1ml given IM in left deltoid.  Tolerated well.  Band-Aid applied.

## 2014-08-09 ENCOUNTER — Ambulatory Visit (INDEPENDENT_AMBULATORY_CARE_PROVIDER_SITE_OTHER): Payer: BLUE CROSS/BLUE SHIELD | Admitting: *Deleted

## 2014-08-09 DIAGNOSIS — E538 Deficiency of other specified B group vitamins: Secondary | ICD-10-CM

## 2014-08-09 MED ORDER — CYANOCOBALAMIN 1000 MCG/ML IJ SOLN
1000.0000 ug | INTRAMUSCULAR | Status: DC
  Administered 2014-08-09 – 2015-02-21 (×5): 1000 ug via INTRAMUSCULAR

## 2014-08-09 NOTE — Progress Notes (Signed)
Patient here for B12 injection. Under aseptic technique Cyanocobalamin 1073mcg/1ml given IM in Right deltoid.Patient tolerated well, Band-Aid applied

## 2014-08-09 NOTE — Patient Instructions (Signed)
Patient will return in one month for next injection. 

## 2014-11-01 ENCOUNTER — Ambulatory Visit (INDEPENDENT_AMBULATORY_CARE_PROVIDER_SITE_OTHER): Payer: BLUE CROSS/BLUE SHIELD | Admitting: *Deleted

## 2014-11-01 DIAGNOSIS — E538 Deficiency of other specified B group vitamins: Secondary | ICD-10-CM | POA: Diagnosis not present

## 2014-11-01 NOTE — Patient Instructions (Signed)
Pt was instructed that he would receive a 1 ml injection of Vit B12 into his left deltoid.

## 2014-11-29 ENCOUNTER — Ambulatory Visit (INDEPENDENT_AMBULATORY_CARE_PROVIDER_SITE_OTHER): Payer: BLUE CROSS/BLUE SHIELD

## 2014-11-29 DIAGNOSIS — E538 Deficiency of other specified B group vitamins: Secondary | ICD-10-CM

## 2014-12-06 ENCOUNTER — Other Ambulatory Visit: Payer: Self-pay | Admitting: Diagnostic Neuroimaging

## 2014-12-27 ENCOUNTER — Ambulatory Visit (INDEPENDENT_AMBULATORY_CARE_PROVIDER_SITE_OTHER): Payer: BLUE CROSS/BLUE SHIELD | Admitting: *Deleted

## 2014-12-27 DIAGNOSIS — E538 Deficiency of other specified B group vitamins: Secondary | ICD-10-CM

## 2015-01-24 ENCOUNTER — Ambulatory Visit (INDEPENDENT_AMBULATORY_CARE_PROVIDER_SITE_OTHER): Payer: BLUE CROSS/BLUE SHIELD | Admitting: *Deleted

## 2015-01-24 ENCOUNTER — Other Ambulatory Visit: Payer: Self-pay | Admitting: *Deleted

## 2015-01-24 DIAGNOSIS — E538 Deficiency of other specified B group vitamins: Secondary | ICD-10-CM

## 2015-01-24 DIAGNOSIS — G63 Polyneuropathy in diseases classified elsewhere: Principal | ICD-10-CM

## 2015-01-24 MED ORDER — CYANOCOBALAMIN 1000 MCG/ML IJ SOLN
1000.0000 ug | Freq: Once | INTRAMUSCULAR | Status: AC
  Administered 2015-01-24: 1000 ug via INTRAMUSCULAR

## 2015-01-24 NOTE — Progress Notes (Signed)
8:45 am Vit B12 injection given, left deltoid. Patient tol well.  Exp  03/2016 Lot [id number]

## 2015-02-21 ENCOUNTER — Ambulatory Visit (INDEPENDENT_AMBULATORY_CARE_PROVIDER_SITE_OTHER): Payer: BLUE CROSS/BLUE SHIELD | Admitting: *Deleted

## 2015-02-21 DIAGNOSIS — E538 Deficiency of other specified B group vitamins: Secondary | ICD-10-CM

## 2015-02-21 DIAGNOSIS — G63 Polyneuropathy in diseases classified elsewhere: Principal | ICD-10-CM

## 2015-02-21 NOTE — Progress Notes (Signed)
Pt here for B 12 injection.  Under aseptic technique cyanocobalamin 1000mcg/1ml IM given L deltoid.  Tolerated well.  Bandaid applied.  

## 2015-02-21 NOTE — Patient Instructions (Signed)
See next month. 

## 2015-03-05 ENCOUNTER — Other Ambulatory Visit: Payer: Self-pay | Admitting: Diagnostic Neuroimaging

## 2015-03-21 ENCOUNTER — Ambulatory Visit (INDEPENDENT_AMBULATORY_CARE_PROVIDER_SITE_OTHER): Payer: BLUE CROSS/BLUE SHIELD | Admitting: *Deleted

## 2015-03-21 DIAGNOSIS — E539 Vitamin B deficiency, unspecified: Secondary | ICD-10-CM

## 2015-03-21 MED ORDER — CYANOCOBALAMIN 1000 MCG/ML IJ SOLN
1000.0000 ug | Freq: Once | INTRAMUSCULAR | Status: AC
  Administered 2015-03-21: 1000 ug via INTRAMUSCULAR

## 2015-04-18 ENCOUNTER — Ambulatory Visit (INDEPENDENT_AMBULATORY_CARE_PROVIDER_SITE_OTHER): Payer: BLUE CROSS/BLUE SHIELD | Admitting: *Deleted

## 2015-04-18 ENCOUNTER — Encounter: Payer: Self-pay | Admitting: *Deleted

## 2015-04-18 DIAGNOSIS — E538 Deficiency of other specified B group vitamins: Secondary | ICD-10-CM

## 2015-04-18 MED ORDER — CYANOCOBALAMIN 1000 MCG/ML IJ SOLN
1000.0000 ug | Freq: Once | INTRAMUSCULAR | Status: AC
  Administered 2015-04-18: 1000 ug via INTRAMUSCULAR

## 2015-04-18 NOTE — Progress Notes (Signed)
Patient in office today for vitamin B12 injection. Dr Leta Baptist spoke with patient, advised he may follow up with PCP for future injections. Patient lives out of town, so this is more convenient for him.  Patient has appointment with his PCP next month, agrees with plan.

## 2015-04-18 NOTE — Progress Notes (Signed)
Gave 1000mg /mL Vitamin B-12 in right deltoid. Cleaned with alcohol wipe prior to injection. Pt tolerated well. Band-aid applied.

## 2015-06-02 ENCOUNTER — Other Ambulatory Visit: Payer: Self-pay | Admitting: Diagnostic Neuroimaging

## 2015-06-04 ENCOUNTER — Other Ambulatory Visit: Payer: Self-pay

## 2016-06-18 DIAGNOSIS — Z7984 Long term (current) use of oral hypoglycemic drugs: Secondary | ICD-10-CM | POA: Diagnosis not present

## 2016-06-18 DIAGNOSIS — M109 Gout, unspecified: Secondary | ICD-10-CM | POA: Diagnosis not present

## 2016-06-18 DIAGNOSIS — E782 Mixed hyperlipidemia: Secondary | ICD-10-CM | POA: Diagnosis not present

## 2016-06-18 DIAGNOSIS — E538 Deficiency of other specified B group vitamins: Secondary | ICD-10-CM | POA: Diagnosis not present

## 2016-06-18 DIAGNOSIS — E119 Type 2 diabetes mellitus without complications: Secondary | ICD-10-CM | POA: Diagnosis not present

## 2017-02-20 DIAGNOSIS — E782 Mixed hyperlipidemia: Secondary | ICD-10-CM | POA: Diagnosis not present

## 2017-02-20 DIAGNOSIS — E119 Type 2 diabetes mellitus without complications: Secondary | ICD-10-CM | POA: Diagnosis not present

## 2017-02-20 DIAGNOSIS — M109 Gout, unspecified: Secondary | ICD-10-CM | POA: Diagnosis not present

## 2017-02-20 DIAGNOSIS — E538 Deficiency of other specified B group vitamins: Secondary | ICD-10-CM | POA: Diagnosis not present

## 2017-02-20 DIAGNOSIS — Z0000 Encounter for general adult medical examination without abnormal findings: Secondary | ICD-10-CM | POA: Diagnosis not present

## 2017-03-03 DIAGNOSIS — L03116 Cellulitis of left lower limb: Secondary | ICD-10-CM | POA: Diagnosis not present

## 2017-03-09 DIAGNOSIS — M79662 Pain in left lower leg: Secondary | ICD-10-CM | POA: Diagnosis not present

## 2017-03-09 DIAGNOSIS — M7989 Other specified soft tissue disorders: Secondary | ICD-10-CM | POA: Diagnosis not present

## 2017-08-12 DIAGNOSIS — M25551 Pain in right hip: Secondary | ICD-10-CM | POA: Diagnosis not present

## 2017-08-12 DIAGNOSIS — E782 Mixed hyperlipidemia: Secondary | ICD-10-CM | POA: Diagnosis not present

## 2017-08-12 DIAGNOSIS — E119 Type 2 diabetes mellitus without complications: Secondary | ICD-10-CM | POA: Diagnosis not present

## 2017-08-12 DIAGNOSIS — M109 Gout, unspecified: Secondary | ICD-10-CM | POA: Diagnosis not present

## 2017-08-12 DIAGNOSIS — E538 Deficiency of other specified B group vitamins: Secondary | ICD-10-CM | POA: Diagnosis not present

## 2017-09-09 DIAGNOSIS — G629 Polyneuropathy, unspecified: Secondary | ICD-10-CM | POA: Diagnosis not present

## 2017-09-09 DIAGNOSIS — M109 Gout, unspecified: Secondary | ICD-10-CM | POA: Diagnosis not present

## 2017-09-09 DIAGNOSIS — E782 Mixed hyperlipidemia: Secondary | ICD-10-CM | POA: Diagnosis not present

## 2017-10-07 DIAGNOSIS — E119 Type 2 diabetes mellitus without complications: Secondary | ICD-10-CM | POA: Diagnosis not present

## 2017-11-18 DIAGNOSIS — E782 Mixed hyperlipidemia: Secondary | ICD-10-CM | POA: Diagnosis not present

## 2017-11-18 DIAGNOSIS — E119 Type 2 diabetes mellitus without complications: Secondary | ICD-10-CM | POA: Diagnosis not present

## 2017-12-18 ENCOUNTER — Other Ambulatory Visit: Payer: Self-pay | Admitting: Orthopaedic Surgery

## 2018-01-06 DIAGNOSIS — M7021 Olecranon bursitis, right elbow: Secondary | ICD-10-CM | POA: Diagnosis not present

## 2018-01-14 NOTE — Pre-Procedure Instructions (Signed)
LONZY MATO  01/14/2018      [facility] [place] [address] [place] [address] Phone: [phone] Fax: [phone]  [facility] [phone] - [place], [place] - [address] [place] [address] Phone: [phone] Fax: [phone]    Your procedure is scheduled on Tues., January 26, 2018 from 12:59PM-3:13PM  Report to [facility] Admitting Entrance "A" at 10:50AM  Call this number if you have problems the morning of surgery:  [phone]   Remember:  Do not eat or drink after midnight on July 29th    Take these medicines the morning of surgery with A SIP OF WATER: Allopurinol (ZYLOPRIM) and Gabapentin (NEURONTIN)  7 days before surgery (7/23), stop taking all Other Aspirin Products, Vitamins, Fish oils, and Herbal medications. Also stop all NSAIDS i.e. Advil, Ibuprofen, Motrin, Aleve, Anaprox, Naproxen, BC, Goody Powders, and all Supplements.    Do not wear jewelry.  Do not wear lotions, powders, colognes, or deodorant.  Do not shave 48 hours prior to surgery.  Men may shave face.  Do not bring valuables to the hospital.  [facility] is not responsible for any belongings or valuables.  Contacts, dentures or bridgework may not be worn into surgery.  Leave your suitcase in the car.  After surgery it may be brought to your room.  For patients admitted to the hospital, discharge time will be determined by your treatment team.  Patients discharged the day of surgery will not be allowed to drive home.   Special instructions:  [place]- Preparing For Surgery  Before surgery, you can play an important role. Because skin is not sterile, your skin needs to be as free of germs as possible. You can reduce the number of germs on your skin by washing with CHG (chlorahexidine gluconate) Soap before surgery.  CHG is an antiseptic cleaner  which kills germs and bonds with the skin to continue killing germs even after washing.    Oral Hygiene is also important to reduce your risk of infection.  Remember - BRUSH YOUR TEETH THE MORNING OF SURGERY WITH YOUR REGULAR TOOTHPASTE  Please do not use if you have an allergy to CHG or antibacterial soaps. If your skin becomes reddened/irritated stop using the CHG.  Do not shave (including legs and underarms) for at least 48 hours prior to first CHG shower. It is OK to shave your face.  Please follow these instructions carefully.   1. Shower the NIGHT BEFORE SURGERY and the MORNING OF SURGERY with CHG.   2. If you chose to wash your hair, wash your hair first as usual with your normal shampoo.  3. After you shampoo, rinse your hair and body thoroughly to remove the shampoo.  4. Use CHG as you would any other liquid soap. You can apply CHG directly to the skin and wash gently with a scrungie or a clean washcloth.   5. Apply the CHG Soap to your body ONLY FROM THE NECK DOWN.  Do not use on open wounds or open sores. Avoid contact with your eyes, ears, mouth and genitals (private parts). Wash Face and genitals (private parts)  with your normal soap.  6. Wash thoroughly, paying special attention to the area where your surgery will be performed.  7. Thoroughly rinse your body with warm water from the neck  down.  8. DO NOT shower/wash with your normal soap after using and rinsing off the CHG Soap.  9. Pat yourself dry with a CLEAN TOWEL.  10. Wear CLEAN PAJAMAS to bed the night before surgery, wear comfortable clothes the morning of surgery  11. Place CLEAN SHEETS on your bed the night of your first shower and DO NOT SLEEP WITH PETS.  Day of Surgery:  Do not apply any deodorants/lotions.  Please wear clean clothes to the hospital/surgery center.   Remember to brush your teeth WITH YOUR REGULAR TOOTHPASTE.  Please read over the following fact sheets that you were given. Pain Booklet,  Coughing and Deep Breathing, MRSA Information and Surgical Site Infection Prevention

## 2018-01-15 ENCOUNTER — Other Ambulatory Visit: Payer: Self-pay

## 2018-01-15 ENCOUNTER — Encounter (HOSPITAL_COMMUNITY)
Admission: RE | Admit: 2018-01-15 | Discharge: 2018-01-15 | Disposition: A | Discharge status: Home / Self Care | Payer: No Typology Code available for payment source | Source: Ambulatory Visit | Attending: Orthopaedic Surgery | Admitting: Orthopaedic Surgery

## 2018-01-15 ENCOUNTER — Encounter (HOSPITAL_COMMUNITY): Payer: Self-pay

## 2018-01-15 ENCOUNTER — Ambulatory Visit (HOSPITAL_COMMUNITY)
Admission: RE | Admit: 2018-01-15 | Discharge: 2018-01-15 | Disposition: A | Discharge status: Home / Self Care | Payer: No Typology Code available for payment source | Source: Ambulatory Visit | Attending: Orthopaedic Surgery | Admitting: Orthopaedic Surgery

## 2018-01-15 DIAGNOSIS — Z01812 Encounter for preprocedural laboratory examination: Secondary | ICD-10-CM | POA: Diagnosis present

## 2018-01-15 DIAGNOSIS — Z01810 Encounter for preprocedural cardiovascular examination: Secondary | ICD-10-CM | POA: Insufficient documentation

## 2018-01-15 DIAGNOSIS — Z01818 Encounter for other preprocedural examination: Secondary | ICD-10-CM | POA: Diagnosis present

## 2018-01-15 HISTORY — DX: Prediabetes: R73.03

## 2018-01-15 HISTORY — DX: Osteoarthritis of hip, unspecified: M16.9

## 2018-01-15 LAB — BASIC METABOLIC PANEL
ANION GAP: 13 (ref 5–15)
BUN: 13 mg/dL (ref 6–20)
CO2: 22 mmol/L (ref 22–32)
Calcium: 9.9 mg/dL (ref 8.9–10.3)
Chloride: 105 mmol/L (ref 98–111)
Creatinine, Ser: 1.03 mg/dL (ref 0.61–1.24)
GFR calc Af Amer: >60 mL/min (ref >60)
GFR calc non Af Amer: >60 mL/min (ref >60)
GLUCOSE: 110 mg/dL — AB (ref 70–99)
POTASSIUM: 4.1 mmol/L (ref 3.5–5.1)
Sodium: 140 mmol/L (ref 135–145)

## 2018-01-15 LAB — CBC WITH DIFFERENTIAL/PLATELET
ABS IMMATURE GRANULOCYTES: 0.1 10*3/uL (ref 0.0–0.1)
Basophils Absolute: 0.1 10*3/uL (ref 0.0–0.1)
Basophils Relative: 1 %
Eosinophils Absolute: 0.2 10*3/uL (ref 0.0–0.7)
Eosinophils Relative: 2 %
HCT: 46.8 % (ref 39.0–52.0)
HEMOGLOBIN: 15.4 g/dL (ref 13.0–17.0)
Immature Granulocytes: 1 %
LYMPHS PCT: 21 %
Lymphs Abs: 1.4 10*3/uL (ref 0.7–4.0)
MCH: 29.5 pg (ref 26.0–34.0)
MCHC: 32.9 g/dL (ref 30.0–36.0)
MCV: 89.7 fL (ref 78.0–100.0)
MONO ABS: 0.7 10*3/uL (ref 0.1–1.0)
MONOS PCT: 10 %
NEUTROS ABS: 4.5 10*3/uL (ref 1.7–7.7)
Neutrophils Relative %: 65 %
Platelets: 292 10*3/uL (ref 150–400)
RBC: 5.22 MIL/uL (ref 4.22–5.81)
RDW: 12 % (ref 11.5–15.5)
WBC: 6.9 10*3/uL (ref 4.0–10.5)

## 2018-01-15 LAB — URINALYSIS, ROUTINE W REFLEX MICROSCOPIC
BILIRUBIN URINE: NEGATIVE
Glucose, UA: NEGATIVE mg/dL
Hgb urine dipstick: NEGATIVE
Ketones, ur: NEGATIVE mg/dL
Leukocytes, UA: NEGATIVE
NITRITE: NEGATIVE
PH: 6 (ref 5.0–8.0)
Protein, ur: NEGATIVE mg/dL
Specific Gravity, Urine: 1.003 — ABNORMAL LOW (ref 1.005–1.030)

## 2018-01-15 LAB — APTT: aPTT: 32 seconds (ref 24–36)

## 2018-01-15 LAB — HEMOGLOBIN A1C
Hgb A1c MFr Bld: 6.1 % — ABNORMAL HIGH (ref 4.8–5.6)
Mean Plasma Glucose: 128.37 mg/dL

## 2018-01-15 LAB — GLUCOSE, CAPILLARY: GLUCOSE-CAPILLARY: 108 mg/dL — AB (ref 70–99)

## 2018-01-15 LAB — TYPE AND SCREEN
ABO/RH(D): O POS
ANTIBODY SCREEN: NEGATIVE

## 2018-01-15 LAB — ABO/RH: ABO/RH(D): O POS

## 2018-01-15 LAB — PROTIME-INR
INR: 0.96
Prothrombin Time: 12.7 seconds (ref 11.4–15.2)

## 2018-01-15 LAB — SURGICAL PCR SCREEN
MRSA, PCR: NEGATIVE
STAPHYLOCOCCUS AUREUS: POSITIVE — AB

## 2018-01-15 NOTE — Progress Notes (Signed)
   01/15/18 0858  OBSTRUCTIVE SLEEP APNEA  Have you ever been diagnosed with sleep apnea through a sleep study? No  Do you snore loudly (loud enough to be heard through closed doors)?  1  Do you often feel tired, fatigued, or sleepy during the daytime (such as falling asleep during driving or talking to someone)? 1  Has anyone observed you stop breathing during your sleep? 1  Do you have, or are you being treated for high blood pressure? 0  BMI more than 35 kg/m2? 0  Age > 50 (1-yes) 1  Neck circumference greater than:Male 16 inches or larger, Male 17inches or larger? 1 (18.5)  Male Gender (Yes=1) 1  Obstructive Sleep Apnea Score 6  Score 5 or greater  Results sent to PCP

## 2018-01-15 NOTE — Progress Notes (Signed)
Pt made aware of positive pcr Staph result. Prescription called in to the [facility].

## 2018-01-15 NOTE — Progress Notes (Signed)
PCP - Dr. Darcus Austin  Cardiologist - Denies  Chest x-ray - 01/15/18  EKG - 01/15/18  Stress Test - Denies  ECHO - Denies  Cardiac Cath - Denies  Sleep Study - Denies- Positive Stop Bang- sent to PCP CPAP - None  LABS- 01/15/18: CBC w/D, BMP, PT, PTT, T/S, UA  ASA- Denies  HA1C- 01/15/18 Fasting Blood Sugar - 90's, Today 108 Checks Blood Sugar ___1__ time a day- Borderline, only checks bs   Anesthesia- Yes- EKG  Pt denies having chest pain, sob, or fever at this time. All instructions explained to the pt, with a verbal understanding of the material. Pt agrees to go over the instructions while at home for a better understanding. The opportunity to ask questions was provided.

## 2018-01-18 NOTE — Progress Notes (Signed)
Anesthesia Chart Review:  Case:  [id number] Date/Time:  01/26/18 1244   Procedure:  TOTAL HIP ARTHROPLASTY ANTERIOR APPROACH (Right )   Anesthesia type:  Spinal   Pre-op diagnosis:  RIGHT HIP DEGENERATIVE JOINT DISEASE   Location:  [facility] ROOM 05 / [facility]   Surgeon:  Melrose Nakayama, MD      DISCUSSION: Patient is a 62 year old male scheduled for the above procedure.  History includes former smoker (quit '05), pre-diabetes, hearing loss, cancer (left lower eye lid BCC, s/p surgery '07). OSA screening score 6.    Based on currently available information, I would anticipate he can proceed as planned if no acute changes.   VS: BP (!) 157/82   Pulse 66   Temp 36.8 C   Resp 20   Ht 6\' 4"  (1.93 m)   Wt 273 lb 11.2 oz (124.1 kg)   SpO2 99%   BMI 33.32 kg/m   PROVIDERS: Darcus Austin, MD is PCP   LABS: Labs reviewed: Acceptable for surgery. (all labs ordered are listed, but only abnormal results are displayed)  Labs Reviewed  SURGICAL PCR SCREEN - Abnormal; Notable for the following components:      Result Value   Staphylococcus aureus POSITIVE (*)    All other components within normal limits  GLUCOSE, CAPILLARY - Abnormal; Notable for the following components:   Glucose-Capillary 108 (*)    All other components within normal limits  BASIC METABOLIC PANEL - Abnormal; Notable for the following components:   Glucose, Bld 110 (*)    All other components within normal limits  URINALYSIS, ROUTINE W REFLEX MICROSCOPIC - Abnormal; Notable for the following components:   Color, Urine STRAW (*)    Specific Gravity, Urine 1.003 (*)    All other components within normal limits  HEMOGLOBIN A1C - Abnormal; Notable for the following components:   Hgb A1c MFr Bld 6.1 (*)    All other components within normal limits  APTT  CBC WITH DIFFERENTIAL/PLATELET  PROTIME-INR  TYPE AND SCREEN  ABO/RH    IMAGES: CXR 01/15/18: IMPRESSION: 1. No active cardiopulmonary abnormalities.  EKG: EKG  01/15/18: NSR, early R transition V leads. No old tracing to compare.   CV: N/A  Past Medical History:  Diagnosis Date  . Cancer ([place])   . Degenerative joint disease (DJD) of hip    Right  . Gout   . Hearing loss   . Pre-diabetes     Past Surgical History:  Procedure Laterality Date  . INNER EAR SURGERY     Left  . OTHER SURGICAL HISTORY Left 2007   L eye cancer and reconstruct    MEDICATIONS: . allopurinol (ZYLOPRIM) 300 MG tablet  . gabapentin (NEURONTIN) 300 MG capsule  . gemfibrozil (LOPID) 600 MG tablet  . ibuprofen (ADVIL,MOTRIN) 200 MG tablet  . vitamin B-12 (CYANOCOBALAMIN) 1000 MCG tablet   No current facility-administered medications for this encounter.    George Hugh [facility]/Anesthesiology Phone [phone] 01/18/2018 2:19 PM

## 2018-01-21 NOTE — H&P (Signed)
TOTAL HIP ADMISSION H&P  Patient is admitted for right total hip arthroplasty.  Subjective:  Chief Complaint: right hip pain  HPI: Stephen Rodgers, 62 y.o. male, has a history of pain and functional disability in the right hip(s) due to arthritis and patient has failed non-surgical conservative treatments for greater than 12 weeks to include NSAID's and/or analgesics, corticosteriod injections, flexibility and strengthening excercises, use of assistive devices, weight reduction as appropriate and activity modification.  Onset of symptoms was gradual starting 5 years ago with gradually worsening course since that time.The patient noted no past surgery on the right hip(s).  Patient currently rates pain in the right hip at 10 out of 10 with activity. Patient has night pain, worsening of pain with activity and weight bearing, trendelenberg gait, pain that interfers with activities of daily living and crepitus. Patient has evidence of subchondral cysts, subchondral sclerosis, periarticular osteophytes and joint space narrowing by imaging studies. This condition presents safety issues increasing the risk of falls. There is no current active infection.  Patient Active Problem List   Diagnosis Date Noted  . 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 Cancer ([place])   . Degenerative joint disease (DJD) of hip    Right  . Gout   . Hearing loss   . Pre-diabetes     Past Surgical History:  Procedure Laterality Date  . INNER EAR SURGERY     Left  . OTHER SURGICAL HISTORY Left 2007   L eye cancer and reconstruct    No current facility-administered medications for this encounter.    Current Outpatient Medications  Medication Sig Dispense Refill Last Dose  . allopurinol (ZYLOPRIM) 300 MG tablet Take 300 mg by mouth daily.   Taking  . gabapentin (NEURONTIN) 300 MG capsule TAKE 1 CAPSULE TWICE A DAY (SCHEDULE FOLLOW UP APPOINTMENT) 180 capsule 0   .  gemfibrozil (LOPID) 600 MG tablet Take 600 mg by mouth daily.     Marland Kitchen ibuprofen (ADVIL,MOTRIN) 200 MG tablet Take 8,000 mg by mouth every 6 (six) hours as needed for moderate pain.     . vitamin B-12 (CYANOCOBALAMIN) 1000 MCG tablet Take 1,000 mcg by mouth daily.      Allergies  Allergen Reactions  . Peanut-Containing Drug Products     Unknown reaction    Social History   Tobacco Use  . Smoking status: Former Smoker    Last attempt to quit: 11/13/2003    Years since quitting: 14.2  . Smokeless tobacco: Never Used  Substance Use Topics  . Alcohol use: Yes    Comment: beer occasionally    Family History  Problem Relation Age of Onset  . Hypertension Mother        Died in [place]  . Other Mother        Facilities manager  . Colon cancer Father        PD, Triple bypass, died 43  . COPD Brother        Died 2014  . Stomach cancer Maternal Grandmother   . Lung cancer Maternal Grandfather      Review of Systems  Musculoskeletal: Positive for joint pain.       Right hip  All other systems reviewed and are negative.   Objective:  Physical Exam  Constitutional: He is oriented to person, place, and time. He appears well-developed and well-nourished.  HENT:  Head: Normocephalic and atraumatic.  Eyes: Pupils are equal, round,  and reactive to light.  Neck: Normal range of motion.  Cardiovascular: Normal rate and regular rhythm.  Respiratory: Effort normal.  GI: Soft.  Musculoskeletal:  Right hip motion remains extremely painful in internal rotation.  This causes pain at his groin.  Opposite hip moves well.  Straight leg raise is negative on both sides.  Sensation and motor function are intact in his feet with palpable pulses on both sides.  Neurological: He is alert and oriented to person, place, and time.  Skin: Skin is warm and dry.  Psychiatric: He has a normal mood and affect. His behavior is normal. Judgment and thought content normal.    Vital signs in last 24 hours:     Labs:   Estimated body mass index is 33.32 kg/m as calculated from the following:   Height as of 01/15/18: 6\' 4"  (1.93 m).   Weight as of 01/15/18: 124.1 kg (273 lb 11.2 oz).   Imaging Review Plain radiographs demonstrate severe degenerative joint disease of the right hip(s). The bone quality appears to be good for age and reported activity level.    Preoperative templating of the joint replacement has been completed, documented, and submitted to the Operating Room personnel in order to optimize intra-operative equipment management.     Assessment/Plan:  End stage primary arthritis, right hip(s)  The patient history, physical examination, clinical judgement of the provider and imaging studies are consistent with end stage degenerative joint disease of the right hip(s) and total hip arthroplasty is deemed medically necessary. The treatment options including medical management, injection therapy, arthroscopy and arthroplasty were discussed at length. The risks and benefits of total hip arthroplasty were presented and reviewed. The risks due to aseptic loosening, infection, stiffness, dislocation/subluxation,  thromboembolic complications and other imponderables were discussed.  The patient acknowledged the explanation, agreed to proceed with the plan and consent was signed. Patient is being admitted for inpatient treatment for surgery, pain control, PT, OT, prophylactic antibiotics, VTE prophylaxis, progressive ambulation and ADL's and discharge planning.The patient is planning to be discharged home with home health services

## 2018-01-25 MED ORDER — TRANEXAMIC ACID 1000 MG/10ML IV SOLN
1000.0000 mg | INTRAVENOUS | Status: AC
  Administered 2018-01-26: 1000 mg via INTRAVENOUS
  Filled 2018-01-25: qty 1100

## 2018-01-25 MED ORDER — TRANEXAMIC ACID 1000 MG/10ML IV SOLN
2000.0000 mg | INTRAVENOUS | Status: AC
  Administered 2018-01-26: 2000 mg via TOPICAL
  Filled 2018-01-25: qty 20

## 2018-01-25 MED ORDER — BUPIVACAINE LIPOSOME 1.3 % IJ SUSP
20.0000 mL | INTRAMUSCULAR | Status: AC
  Administered 2018-01-26: 20 mL
  Filled 2018-01-25: qty 20

## 2018-01-25 MED ORDER — DEXTROSE 5 % IV SOLN
3.0000 g | INTRAVENOUS | Status: AC
  Administered 2018-01-26: 3 g via INTRAVENOUS
  Filled 2018-01-25: qty 3

## 2018-01-26 ENCOUNTER — Inpatient Hospital Stay (HOSPITAL_COMMUNITY): Payer: No Typology Code available for payment source | Admitting: Vascular Surgery

## 2018-01-26 ENCOUNTER — Inpatient Hospital Stay (HOSPITAL_COMMUNITY)
Admission: RE | Admit: 2018-01-26 | Discharge: 2018-01-28 | DRG: 470 | Disposition: A | Discharge status: Home Health | Payer: No Typology Code available for payment source | Attending: Orthopaedic Surgery | Admitting: Orthopaedic Surgery

## 2018-01-26 ENCOUNTER — Encounter (HOSPITAL_COMMUNITY): Payer: Self-pay

## 2018-01-26 ENCOUNTER — Other Ambulatory Visit: Payer: Self-pay

## 2018-01-26 ENCOUNTER — Encounter (HOSPITAL_COMMUNITY)
Admission: RE | Disposition: A | Discharge status: Home Health | Payer: Self-pay | Source: Ambulatory Visit | Attending: Orthopaedic Surgery

## 2018-01-26 ENCOUNTER — Inpatient Hospital Stay (HOSPITAL_COMMUNITY): Payer: No Typology Code available for payment source

## 2018-01-26 ENCOUNTER — Inpatient Hospital Stay (HOSPITAL_COMMUNITY): Payer: No Typology Code available for payment source | Admitting: Certified Registered Nurse Anesthetist

## 2018-01-26 DIAGNOSIS — E538 Deficiency of other specified B group vitamins: Secondary | ICD-10-CM | POA: Diagnosis present

## 2018-01-26 DIAGNOSIS — Z85840 Personal history of malignant neoplasm of eye: Secondary | ICD-10-CM

## 2018-01-26 DIAGNOSIS — R7303 Prediabetes: Secondary | ICD-10-CM | POA: Diagnosis present

## 2018-01-26 DIAGNOSIS — Z79899 Other long term (current) drug therapy: Secondary | ICD-10-CM

## 2018-01-26 DIAGNOSIS — Z419 Encounter for procedure for purposes other than remedying health state, unspecified: Secondary | ICD-10-CM

## 2018-01-26 DIAGNOSIS — Z87891 Personal history of nicotine dependence: Secondary | ICD-10-CM

## 2018-01-26 DIAGNOSIS — M25551 Pain in right hip: Secondary | ICD-10-CM | POA: Diagnosis present

## 2018-01-26 DIAGNOSIS — M1611 Unilateral primary osteoarthritis, right hip: Secondary | ICD-10-CM | POA: Diagnosis present

## 2018-01-26 HISTORY — PX: TOTAL HIP ARTHROPLASTY: SHX124

## 2018-01-26 LAB — GLUCOSE, CAPILLARY: Glucose-Capillary: 110 mg/dL — ABNORMAL HIGH (ref 70–99)

## 2018-01-26 SURGERY — ARTHROPLASTY, HIP, TOTAL, ANTERIOR APPROACH
Anesthesia: Spinal | Site: Hip | Laterality: Right

## 2018-01-26 MED ORDER — BUPIVACAINE-EPINEPHRINE (PF) 0.25% -1:200000 IJ SOLN
INTRAMUSCULAR | Status: DC | PRN
  Administered 2018-01-26: 30 mL

## 2018-01-26 MED ORDER — MORPHINE SULFATE (PF) 2 MG/ML IV SOLN: 0.5000 mg | INTRAVENOUS | Status: DC | PRN

## 2018-01-26 MED ORDER — ONDANSETRON HCL 4 MG PO TABS: 4.0000 mg | ORAL_TABLET | Freq: Four times a day (QID) | ORAL | Status: DC | PRN

## 2018-01-26 MED ORDER — MIDAZOLAM HCL 2 MG/2ML IJ SOLN
INTRAMUSCULAR | Status: DC | PRN
  Administered 2018-01-26 (×2): 1 mg via INTRAVENOUS

## 2018-01-26 MED ORDER — METHOCARBAMOL 1000 MG/10ML IJ SOLN
500.0000 mg | Freq: Four times a day (QID) | INTRAVENOUS | Status: DC | PRN
  Filled 2018-01-26: qty 5

## 2018-01-26 MED ORDER — PROPOFOL 10 MG/ML IV BOLUS
INTRAVENOUS | Status: DC | PRN
  Administered 2018-01-26: 20 mg via INTRAVENOUS

## 2018-01-26 MED ORDER — CEFAZOLIN SODIUM-DEXTROSE 2-4 GM/100ML-% IV SOLN
2.0000 g | Freq: Four times a day (QID) | INTRAVENOUS | Status: AC
  Administered 2018-01-26 – 2018-01-27 (×2): 2 g via INTRAVENOUS
  Filled 2018-01-26 (×2): qty 100

## 2018-01-26 MED ORDER — METHOCARBAMOL 500 MG PO TABS
500.0000 mg | ORAL_TABLET | Freq: Four times a day (QID) | ORAL | Status: DC | PRN
  Administered 2018-01-26 – 2018-01-27 (×2): 500 mg via ORAL
  Filled 2018-01-26 (×2): qty 1

## 2018-01-26 MED ORDER — ALUM & MAG HYDROXIDE-SIMETH 200-200-20 MG/5ML PO SUSP: 30.0000 mL | ORAL | Status: DC | PRN

## 2018-01-26 MED ORDER — DOCUSATE SODIUM 100 MG PO CAPS
100.0000 mg | ORAL_CAPSULE | Freq: Two times a day (BID) | ORAL | Status: DC
  Administered 2018-01-26 – 2018-01-28 (×4): 100 mg via ORAL
  Filled 2018-01-26 (×4): qty 1

## 2018-01-26 MED ORDER — SODIUM CHLORIDE 0.9 % IV SOLN
INTRAVENOUS | Status: DC | PRN
  Administered 2018-01-26: 25 ug/min via INTRAVENOUS

## 2018-01-26 MED ORDER — HYDROCODONE-ACETAMINOPHEN 7.5-325 MG PO TABS: 1.0000 | ORAL_TABLET | ORAL | Status: DC | PRN

## 2018-01-26 MED ORDER — PHENYLEPHRINE 40 MCG/ML (10ML) SYRINGE FOR IV PUSH (FOR BLOOD PRESSURE SUPPORT)
PREFILLED_SYRINGE | INTRAVENOUS | Status: DC | PRN
  Administered 2018-01-26 (×2): 80 ug via INTRAVENOUS

## 2018-01-26 MED ORDER — ONDANSETRON HCL 4 MG/2ML IJ SOLN: 4.0000 mg | Freq: Four times a day (QID) | INTRAMUSCULAR | Status: DC | PRN

## 2018-01-26 MED ORDER — ALLOPURINOL 300 MG PO TABS
300.0000 mg | ORAL_TABLET | Freq: Every day | ORAL | Status: DC
  Administered 2018-01-27 – 2018-01-28 (×2): 300 mg via ORAL
  Filled 2018-01-26 (×3): qty 1

## 2018-01-26 MED ORDER — DIPHENHYDRAMINE HCL 12.5 MG/5ML PO ELIX: 12.5000 mg | ORAL_SOLUTION | ORAL | Status: DC | PRN

## 2018-01-26 MED ORDER — 0.9 % SODIUM CHLORIDE (POUR BTL) OPTIME
TOPICAL | Status: DC | PRN
  Administered 2018-01-26: 1000 mL

## 2018-01-26 MED ORDER — ONDANSETRON HCL 4 MG/2ML IJ SOLN: 4.0000 mg | Freq: Once | INTRAMUSCULAR | Status: DC | PRN

## 2018-01-26 MED ORDER — PROPOFOL 500 MG/50ML IV EMUL
INTRAVENOUS | Status: DC | PRN
  Administered 2018-01-26: 80 ug/kg/min via INTRAVENOUS

## 2018-01-26 MED ORDER — BUPIVACAINE IN DEXTROSE 0.75-8.25 % IT SOLN
INTRATHECAL | Status: DC | PRN
  Administered 2018-01-26: 2 mL via INTRATHECAL

## 2018-01-26 MED ORDER — BISACODYL 5 MG PO TBEC: 5.0000 mg | DELAYED_RELEASE_TABLET | Freq: Every day | ORAL | Status: DC | PRN

## 2018-01-26 MED ORDER — GABAPENTIN 300 MG PO CAPS
300.0000 mg | ORAL_CAPSULE | Freq: Two times a day (BID) | ORAL | Status: DC
  Administered 2018-01-26 – 2018-01-28 (×4): 300 mg via ORAL
  Filled 2018-01-26 (×4): qty 1

## 2018-01-26 MED ORDER — METOCLOPRAMIDE HCL 5 MG/ML IJ SOLN: 5.0000 mg | Freq: Three times a day (TID) | INTRAMUSCULAR | Status: DC | PRN

## 2018-01-26 MED ORDER — MIDAZOLAM HCL 2 MG/2ML IJ SOLN
INTRAMUSCULAR | Status: AC
  Filled 2018-01-26: qty 2

## 2018-01-26 MED ORDER — LACTATED RINGERS IV SOLN
INTRAVENOUS | Status: DC
  Administered 2018-01-26 – 2018-01-27 (×2): via INTRAVENOUS

## 2018-01-26 MED ORDER — MENTHOL 3 MG MT LOZG: 1.0000 | LOZENGE | OROMUCOSAL | Status: DC | PRN

## 2018-01-26 MED ORDER — HYDROCODONE-ACETAMINOPHEN 5-325 MG PO TABS
ORAL_TABLET | ORAL | Status: AC
  Filled 2018-01-26: qty 2

## 2018-01-26 MED ORDER — GEMFIBROZIL 600 MG PO TABS
600.0000 mg | ORAL_TABLET | Freq: Every day | ORAL | Status: DC
  Administered 2018-01-27 – 2018-01-28 (×2): 600 mg via ORAL
  Filled 2018-01-26 (×2): qty 1

## 2018-01-26 MED ORDER — METOCLOPRAMIDE HCL 5 MG PO TABS: 5.0000 mg | ORAL_TABLET | Freq: Three times a day (TID) | ORAL | Status: DC | PRN

## 2018-01-26 MED ORDER — ASPIRIN EC 325 MG PO TBEC
325.0000 mg | DELAYED_RELEASE_TABLET | Freq: Two times a day (BID) | ORAL | Status: DC
  Administered 2018-01-27 – 2018-01-28 (×3): 325 mg via ORAL
  Filled 2018-01-26 (×3): qty 1

## 2018-01-26 MED ORDER — ACETAMINOPHEN 325 MG PO TABS
325.0000 mg | ORAL_TABLET | Freq: Four times a day (QID) | ORAL | Status: DC | PRN
  Administered 2018-01-28: 650 mg via ORAL
  Filled 2018-01-26: qty 2

## 2018-01-26 MED ORDER — FENTANYL CITRATE (PF) 250 MCG/5ML IJ SOLN
INTRAMUSCULAR | Status: AC
  Filled 2018-01-26: qty 5

## 2018-01-26 MED ORDER — KETOROLAC TROMETHAMINE 15 MG/ML IJ SOLN
15.0000 mg | Freq: Four times a day (QID) | INTRAMUSCULAR | Status: AC
  Administered 2018-01-26 – 2018-01-27 (×4): 15 mg via INTRAVENOUS
  Filled 2018-01-26 (×4): qty 1

## 2018-01-26 MED ORDER — HYDROCODONE-ACETAMINOPHEN 5-325 MG PO TABS
1.0000 | ORAL_TABLET | ORAL | Status: DC | PRN
  Administered 2018-01-26 (×2): 1 via ORAL
  Administered 2018-01-26: 2 via ORAL
  Administered 2018-01-27 (×3): 1 via ORAL
  Administered 2018-01-28 (×2): 2 via ORAL
  Filled 2018-01-26: qty 2
  Filled 2018-01-26 (×2): qty 1
  Filled 2018-01-26: qty 2
  Filled 2018-01-26: qty 1
  Filled 2018-01-26: qty 2
  Filled 2018-01-26: qty 1

## 2018-01-26 MED ORDER — FENTANYL CITRATE (PF) 100 MCG/2ML IJ SOLN: 25.0000 ug | INTRAMUSCULAR | Status: DC | PRN

## 2018-01-26 MED ORDER — FENTANYL CITRATE (PF) 250 MCG/5ML IJ SOLN
INTRAMUSCULAR | Status: DC | PRN
  Administered 2018-01-26: 50 ug via INTRAVENOUS

## 2018-01-26 MED ORDER — PHENOL 1.4 % MT LIQD: 1.0000 | OROMUCOSAL | Status: DC | PRN

## 2018-01-26 MED ORDER — ONDANSETRON HCL 4 MG/2ML IJ SOLN
INTRAMUSCULAR | Status: DC | PRN
  Administered 2018-01-26: 4 mg via INTRAVENOUS

## 2018-01-26 MED ORDER — BUPIVACAINE-EPINEPHRINE (PF) 0.25% -1:200000 IJ SOLN
INTRAMUSCULAR | Status: AC
  Filled 2018-01-26: qty 30

## 2018-01-26 MED ORDER — LACTATED RINGERS IV SOLN
INTRAVENOUS | Status: DC
  Administered 2018-01-26 (×2): via INTRAVENOUS

## 2018-01-26 MED ORDER — CHLORHEXIDINE GLUCONATE 4 % EX LIQD: 60.0000 mL | Freq: Once | CUTANEOUS | Status: DC

## 2018-01-26 MED ORDER — ACETAMINOPHEN 500 MG PO TABS
500.0000 mg | ORAL_TABLET | Freq: Four times a day (QID) | ORAL | Status: AC
  Administered 2018-01-26 – 2018-01-27 (×4): 500 mg via ORAL
  Filled 2018-01-26 (×4): qty 1

## 2018-01-26 SURGICAL SUPPLY — 42 items
BLADE SAW SGTL 18X1.27X75 (BLADE) ×2 IMPLANT
CAPT HIP TOTAL 2 ×2 IMPLANT
CELLS DAT CNTRL 66122 CELL SVR (MISCELLANEOUS) ×1 IMPLANT
COVER PERINEAL POST (MISCELLANEOUS) ×2 IMPLANT
COVER SURGICAL LIGHT HANDLE (MISCELLANEOUS) ×2 IMPLANT
DRAPE C-ARM 42X72 X-RAY (DRAPES) ×2 IMPLANT
DRAPE STERI IOBAN 125X83 (DRAPES) ×2 IMPLANT
DRAPE U-SHAPE 47X51 STRL (DRAPES) ×4 IMPLANT
DRSG AQUACEL AG ADV 3.5X10 (GAUZE/BANDAGES/DRESSINGS) ×2 IMPLANT
DURAPREP 26ML APPLICATOR (WOUND CARE) ×2 IMPLANT
ELECT BLADE 4.0 EZ CLEAN MEGAD (MISCELLANEOUS) ×2
ELECT REM PT RETURN 9FT ADLT (ELECTROSURGICAL) ×2
ELECTRODE BLDE 4.0 EZ CLN MEGD (MISCELLANEOUS) ×1 IMPLANT
ELECTRODE REM PT RTRN 9FT ADLT (ELECTROSURGICAL) ×1 IMPLANT
FACESHIELD WRAPAROUND (MASK) ×4 IMPLANT
GLOVE BIO SURGEON STRL SZ8 (GLOVE) ×4 IMPLANT
GLOVE BIOGEL PI IND STRL 8 (GLOVE) ×2 IMPLANT
GLOVE BIOGEL PI INDICATOR 8 (GLOVE) ×2
GLOVE SURG SS PI 6.5 STRL IVOR (GLOVE) ×4 IMPLANT
GOWN STRL REUS W/ TWL LRG LVL3 (GOWN DISPOSABLE) ×1 IMPLANT
GOWN STRL REUS W/ TWL XL LVL3 (GOWN DISPOSABLE) ×2 IMPLANT
GOWN STRL REUS W/TWL LRG LVL3 (GOWN DISPOSABLE) ×1
GOWN STRL REUS W/TWL XL LVL3 (GOWN DISPOSABLE) ×2
HOOD PEEL AWAY FACE SHEILD DIS (HOOD) ×2 IMPLANT
KIT BASIN OR (CUSTOM PROCEDURE TRAY) ×2 IMPLANT
KIT TURNOVER KIT B (KITS) ×2 IMPLANT
MANIFOLD NEPTUNE II (INSTRUMENTS) ×2 IMPLANT
NEEDLE HYPO 22GX1.5 SAFETY (NEEDLE) ×2 IMPLANT
NS IRRIG 1000ML POUR BTL (IV SOLUTION) ×2 IMPLANT
PACK TOTAL JOINT (CUSTOM PROCEDURE TRAY) ×2 IMPLANT
PAD ARMBOARD 7.5X6 YLW CONV (MISCELLANEOUS) ×2 IMPLANT
RTRCTR WOUND ALEXIS 18CM MED (MISCELLANEOUS) ×2
SLEEVE SURGEON STRL (DRAPES) ×2 IMPLANT
SUT ETHIBOND NAB CT1 #1 30IN (SUTURE) ×4 IMPLANT
SUT VIC AB 1 CT1 27 (SUTURE) ×1
SUT VIC AB 1 CT1 27XBRD ANBCTR (SUTURE) ×1 IMPLANT
SUT VIC AB 2-0 CT1 27 (SUTURE) ×1
SUT VIC AB 2-0 CT1 TAPERPNT 27 (SUTURE) ×1 IMPLANT
SUT VIC AB 3-0 PS2 18 (SUTURE) ×1
SUT VIC AB 3-0 PS2 18XBRD (SUTURE) ×1 IMPLANT
SUT VLOC 180 0 24IN GS25 (SUTURE) ×2 IMPLANT
SYR 50ML LL SCALE MARK (SYRINGE) ×2 IMPLANT

## 2018-01-26 NOTE — Transfer of Care (Signed)
Immediate Anesthesia Transfer of Care Note  Patient: Stephen Rodgers  Procedure(s) Performed: TOTAL HIP ARTHROPLASTY ANTERIOR APPROACH (Right Hip)  Patient Location: PACU  Anesthesia Type:Spinal  Level of Consciousness: awake, alert  and oriented  Airway & Oxygen Therapy: Patient Spontanous Breathing and Patient connected to nasal cannula oxygen  Post-op Assessment: Report given to RN and Post -op Vital signs reviewed and stable  Post vital signs: Reviewed and stable  Last Vitals:  Vitals Value Taken Time  BP 123/70 01/26/2018  3:30 PM  Temp    Pulse 86 01/26/2018  3:30 PM  Resp 11 01/26/2018  3:30 PM  SpO2 98 % 01/26/2018  3:30 PM  Vitals shown include unvalidated device data.  Last Pain:  Vitals:   01/26/18 1106  TempSrc:   PainSc: 5       Patients Stated Pain Goal: 3 (73/66/81 5947)  Complications: No apparent anesthesia complications

## 2018-01-26 NOTE — Op Note (Signed)

## 2018-01-26 NOTE — Anesthesia Preprocedure Evaluation (Addendum)
Anesthesia Evaluation  Patient identified by MRN, date of birth, ID band Patient awake    Reviewed: Allergy & Precautions, NPO status , Patient's Chart, lab work & pertinent test results  Airway Mallampati: II  TM Distance: >3 FB Neck ROM: Full   Comment: Left eye injection Full beard Dental  (+) Poor Dentition, Missing   Pulmonary former smoker,    Pulmonary exam normal breath sounds clear to auscultation       Cardiovascular negative cardio ROS Normal cardiovascular exam Rhythm:Regular Rate:Normal  ECG: NSR, rate 63   Neuro/Psych negative neurological ROS  negative psych ROS   GI/Hepatic negative GI ROS, Neg liver ROS,   Endo/Other  negative endocrine ROS  Renal/GU negative Renal ROS     Musculoskeletal  (+) Arthritis , Osteoarthritis,  Gout   Abdominal (+) + obese,   Peds  Hematology negative hematology ROS (+)   Anesthesia Other Findings RIGHT HIP DEGENERATIVE JOINT DISEASE  Reproductive/Obstetrics                            Anesthesia Physical Anesthesia Plan  ASA: III  Anesthesia Plan: Spinal   Post-op Pain Management:    Induction:   PONV Risk Score and Plan: 1 and Ondansetron, Dexamethasone, Midazolam and Treatment may vary due to age or medical condition  Airway Management Planned: Natural Airway  Additional Equipment:   Intra-op Plan:   Post-operative Plan:   Informed Consent: I have reviewed the patients History and Physical, chart, labs and discussed the procedure including the risks, benefits and alternatives for the proposed anesthesia with the patient or authorized representative who has indicated his/her understanding and acceptance.   Dental advisory given  Plan Discussed with: CRNA  Anesthesia Plan Comments:         Anesthesia Quick Evaluation

## 2018-01-26 NOTE — Progress Notes (Signed)
Pt arrived to room 5N12 after surgery. Received report from [place], [place] in PACU. See assessment. Will continue to monitor.

## 2018-01-26 NOTE — Interval H&P Note (Signed)
History and Physical Interval Note:  01/26/2018 11:54 AM  Stephen Rodgers  has presented today for surgery, with the diagnosis of RIGHT HIP DEGENERATIVE JOINT DISEASE  The various methods of treatment have been discussed with the patient and family. After consideration of risks, benefits and other options for treatment, the patient has consented to  Procedure(s): TOTAL HIP ARTHROPLASTY ANTERIOR APPROACH (Right) as a surgical intervention .  The patient's history has been reviewed, patient examined, no change in status, stable for surgery.  I have reviewed the patient's chart and labs.  Questions were answered to the patient's satisfaction.     Jasmene Goswami G

## 2018-01-26 NOTE — Plan of Care (Signed)
Problem: Education: Goal: Knowledge of General Education information will improve Description Including pain rating scale, medication(s)/side effects and non-pharmacologic comfort measures Outcome: Progressing   Problem: Nutrition: Goal: Adequate nutrition will be maintained Outcome: Progressing   Problem: Pain Managment: Goal: General experience of comfort will improve Outcome: Progressing   Problem: Safety: Goal: Ability to remain free from injury will improve Outcome: Progressing   Problem: Education: Goal: Knowledge of the prescribed therapeutic regimen will improve Outcome: Progressing   Problem: Pain Management: Goal: Pain level will decrease with appropriate interventions Outcome: Progressing

## 2018-01-27 ENCOUNTER — Encounter (HOSPITAL_COMMUNITY): Payer: Self-pay | Admitting: Orthopaedic Surgery

## 2018-01-27 NOTE — Addendum Note (Signed)
Addendum  created 01/27/18 8118 by Murvin Natal, MD   Intraprocedure Blocks edited, Sign clinical note

## 2018-01-27 NOTE — Addendum Note (Signed)
Addendum  created 01/27/18 0850 by Murvin Natal, MD   Child order released for a procedure order, Intraprocedure Blocks edited, Sign clinical note

## 2018-01-27 NOTE — Anesthesia Postprocedure Evaluation (Signed)
Anesthesia Post Note  Patient: Stephen Rodgers  Procedure(s) Performed: TOTAL HIP ARTHROPLASTY ANTERIOR APPROACH (Right Hip)     Patient location during evaluation: PACU Anesthesia Type: Spinal Level of consciousness: oriented and awake and alert Pain management: pain level controlled Vital Signs Assessment: post-procedure vital signs reviewed and stable Respiratory status: spontaneous breathing, respiratory function stable and patient connected to nasal cannula oxygen Cardiovascular status: blood pressure returned to baseline and stable Postop Assessment: no headache, no backache, no apparent nausea or vomiting and spinal receding Anesthetic complications: no    Last Vitals:  Vitals:   01/27/18 0559 01/27/18 0629  BP: 115/78 125/76  Pulse: (!) 102 100  Resp:  15  Temp: (!) 38.9 C (!) 38.5 C  SpO2:  97%    Last Pain:  Vitals:   01/27/18 0700  TempSrc:   PainSc: 6                  Naly Schwanz P Sonakshi Rolland

## 2018-01-27 NOTE — Evaluation (Signed)
Physical Therapy Evaluation Patient Details Name: Stephen Rodgers MRN: [id number] DOB: 13-Aug-1955 Today's Date: 01/27/2018   History of Present Illness  Pt is a 62 y/o male s/p R THA anterior approach. PMH including but not limited to pre-diabetes, cancer, DJD.  Clinical Impression  Pt presented supine in bed with HOB elevated, awake and willing to participate in therapy session. Prior to admission, pt reported that he was independent with all functional mobility and ADLs. Pt continues to work as a Dealer. Pt will have 24/7 supervision/assistance from his wife upon d/c. Pt currently requires min A for bed mobility, min guard for transfers and min guard to ambulate in hallway with RW. Pt would continue to benefit from skilled physical therapy services at this time while admitted and after d/c to address the below listed limitations in order to improve overall safety and independence with functional mobility.     Follow Up Recommendations Home health PT;Supervision/Assistance - 24 hour    Equipment Recommendations  None recommended by PT    Recommendations for Other Services       Precautions / Restrictions Precautions Precautions: Fall Restrictions Weight Bearing Restrictions: Yes RLE Weight Bearing: Weight bearing as tolerated      Mobility  Bed Mobility Overal bed mobility: Needs Assistance Bed Mobility: Supine to Sit     Supine to sit: Min assist     General bed mobility comments: increased time and effort, min A to assist R LE movement off of bed  Transfers Overall transfer level: Needs assistance Equipment used: Rolling walker (2 wheeled) Transfers: Sit to/from Stand Sit to Stand: Min guard         General transfer comment: increased time, cueing for safe hand placement and technique with RW, min guard for safety  Ambulation/Gait Ambulation/Gait assistance: Min guard Gait Distance (Feet): 100 Feet Assistive device: Rolling walker (2 wheeled) Gait  Pattern/deviations: Step-to pattern;Step-through pattern;Decreased step length - right;Decreased step length - left;Decreased stride length;Decreased weight shift to right Gait velocity: decreased Gait velocity interpretation: <1.8 ft/sec, indicate of risk for recurrent falls General Gait Details: pt with mild instability but no overt LOB or need for physical assistance, min guard for safety; cueing for technique and sequencing with RW; pt progressing from step-to pattern to a step-through pattern with practice  Stairs            Wheelchair Mobility    Modified Rankin (Stroke Patients Only)       Balance Overall balance assessment: Needs assistance Sitting-balance support: Feet supported Sitting balance-Leahy Scale: Good     Standing balance support: During functional activity;Bilateral upper extremity supported;Single extremity supported Standing balance-Leahy Scale: Poor                               Pertinent Vitals/Pain Pain Assessment: 0-10 Pain Score: 7  Pain Location: R hip Pain Descriptors / Indicators: Sore;Burning Pain Intervention(s): Monitored during session;Repositioned    Home Living Family/patient expects to be discharged to:: Private residence Living Arrangements: Spouse/significant other Available Help at Discharge: Family;Available 24 hours/day Type of Home: House Home Access: Stairs to enter Entrance Stairs-Rails: None Entrance Stairs-Number of Steps: 1 Home Layout: One level Home Equipment: Clinical cytogeneticist - 2 wheels;Walker - 4 wheels;Cane - single point      Prior Function Level of Independence: Independent         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 Pt with sensation grossly intact throughout    Cervical / Trunk Assessment Cervical / Trunk Assessment: Normal  Communication   Communication: No difficulties  Cognition Arousal/Alertness: Awake/alert Behavior During Therapy: WFL for tasks assessed/performed Overall Cognitive Status: Within Functional Limits for tasks assessed                                        General Comments      Exercises     Assessment/Plan    PT Assessment Patient needs continued PT services  PT Problem List Decreased strength;Decreased range of motion;Decreased activity tolerance;Decreased balance;Decreased mobility;Decreased coordination;Decreased knowledge of use of DME;Decreased safety awareness;Decreased knowledge of precautions;Pain       PT Treatment Interventions DME instruction;Gait training;Stair training;Therapeutic activities;Therapeutic exercise;Functional mobility training;Balance training;Neuromuscular re-education;Patient/family education    PT Goals (Current goals can be found in the Care Plan section)  Acute Rehab PT Goals Patient Stated Goal: decrease pain, potentially go back to work PT Goal Formulation: With patient Time For Goal Achievement: 02/10/18 Potential to Achieve Goals: Good    Frequency 7X/week   Barriers to discharge        Co-evaluation               AM-PAC PT "6 Clicks" Daily Activity  Outcome Measure Difficulty turning over in bed (including adjusting bedclothes, sheets and blankets)?: Unable Difficulty moving from lying on back to sitting on the side of the bed? : Unable Difficulty sitting down on and standing up from a chair with arms (e.g., wheelchair, bedside commode, etc,.)?: Unable Help needed moving to and from a bed to chair (including a wheelchair)?: A Little Help needed walking in hospital room?: A Little Help needed climbing 3-5  steps with a railing? : A Little 6 Click Score: 12    End of Session Equipment Utilized During Treatment: Gait belt Activity Tolerance: Patient tolerated treatment well Patient left: in chair;with call bell/phone within reach Nurse Communication: Mobility status;Other (comment)(IV out upon arrival) PT Visit Diagnosis: Other abnormalities of gait and mobility (R26.89);Pain Pain - Right/Left: Right Pain - part of body: Hip    Time: 2094-7096 PT Time Calculation (min) (ACUTE ONLY): 28 min   Charges:   PT Evaluation $PT Eval Moderate Complexity: 1 Mod PT Treatments $Therapeutic Activity: 8-22 mins        [place], [place], DPT [place] 01/27/2018, 10:22 AM

## 2018-01-27 NOTE — Anesthesia Procedure Notes (Addendum)
Spinal  Patient location during procedure: OR Start time: 01/26/2018 1:10 PM End time: 01/26/2018 1:20 PM Staffing Anesthesiologist: Murvin Natal, MD Performed: anesthesiologist  Preanesthetic Checklist Completed: patient identified, surgical consent, pre-op evaluation, timeout performed, IV checked, risks and benefits discussed and monitors and equipment checked Spinal Block Patient position: sitting Prep: DuraPrep Patient monitoring: cardiac monitor, continuous pulse ox and blood pressure Approach: midline Location: L4-5 Injection technique: single-shot Needle Needle type: Pencan  Needle gauge: 24 G Needle length: 9 cm Assessment Sensory level: T10 Additional Notes Functioning IV was confirmed and monitors were applied. Sterile prep and drape, including hand hygiene and sterile gloves were used. The patient was positioned and the spine was prepped. The skin was anesthetized with lidocaine.  Free flow of clear CSF was obtained prior to injecting local anesthetic into the CSF.  The spinal needle aspirated freely following injection.  The needle was carefully withdrawn.  The patient tolerated the procedure well.

## 2018-01-27 NOTE — Progress Notes (Signed)
Subjective: 1 Day Post-Op Procedure(s) (LRB): TOTAL HIP ARTHROPLASTY ANTERIOR APPROACH (Right)   Patient feels well and has minimal pain. HE had some fevers but does not feel sick. He has been using his IS.  Activity level:  wbat Diet tolerance:  ok Voiding:  ok Patient reports pain as mild.    Objective: Vital signs in last 24 hours: Temp:  [97.6 F (36.4 C)-102.3 F (39.1 C)] 101.3 F (38.5 C) (07/31 0629) Pulse Rate:  [57-102] 100 (07/31 0629) Resp:  [9-20] 15 (07/31 0629) BP: (115-166)/(70-93) 125/76 (07/31 0629) SpO2:  [91 %-98 %] 97 % (07/31 0629) Weight:  [124.1 kg (273 lb 11.2 oz)] 124.1 kg (273 lb 11.2 oz) (07/30 1106)  Labs: No results for input(s): HGB in the last 72 hours. No results for input(s): WBC, RBC, HCT, PLT in the last 72 hours. No results for input(s): NA, K, CL, CO2, BUN, CREATININE, GLUCOSE, CALCIUM in the last 72 hours. No results for input(s): LABPT, INR in the last 72 hours.  Physical Exam:  Neurologically intact ABD soft Neurovascular intact Sensation intact distally Intact pulses distally Dorsiflexion/Plantar flexion intact Incision: dressing C/D/I and no drainage No cellulitis present Compartment soft  Assessment/Plan:  1 Day Post-Op Procedure(s) (LRB): TOTAL HIP ARTHROPLASTY ANTERIOR APPROACH (Right) Advance diet Up with therapy Plan for discharge tomorrow Discharge home with home health if doing well and cleared by PT. Continue on ASA 325mg  BID x 4 weeks post op. Continue IS and PO fluids. I believe that fever is related to inflammation after his THR. Follow up in office 2 weeks post op.  Stephen Rodgers, Stephen Rodgers 01/27/2018, 7:56 AM

## 2018-01-27 NOTE — Progress Notes (Signed)
Orthopedic Tech Progress Note Patient Details:  Stephen Rodgers [birth year] [id number]  Patient ID: [name], male   DOB: 30-Mar-1956, 62 y.o.   MRN: [id number] Pt is in wrong bed to apply OHF. Will ask nurse to call when pt is moved to proper bed.  Karolee Stamps 01/27/2018, 9:09 PM

## 2018-01-27 NOTE — Progress Notes (Signed)
Physical Therapy Treatment Patient Details Name: Stephen Rodgers MRN: [id number] DOB: 1955-12-05 Today's Date: 01/27/2018    History of Present Illness Pt is a 62 y/o male s/p R THA anterior approach. PMH including but not limited to pre-diabetes, cancer, DJD.    PT Comments    Pt making steady progress with functional mobility and progressing well towards achieving his current PT goals. Pt would continue to benefit from skilled physical therapy services at this time while admitted and after d/c to address the below listed limitations in order to improve overall safety and independence with functional mobility.    Follow Up Recommendations  Home health PT;Supervision/Assistance - 24 hour     Equipment Recommendations  None recommended by PT    Recommendations for Other Services       Precautions / Restrictions Precautions Precautions: Fall Restrictions Weight Bearing Restrictions: Yes RLE Weight Bearing: Weight bearing as tolerated    Mobility  Bed Mobility Overal bed mobility: Needs Assistance Bed Mobility: Supine to Sit;Sit to Supine     Supine to sit: Min guard Sit to supine: Min guard   General bed mobility comments: increased time and effort, min guard for safety  Transfers Overall transfer level: Needs assistance Equipment used: Rolling walker (2 wheeled) Transfers: Sit to/from Stand Sit to Stand: Min guard         General transfer comment: increased time, good technique, min guard for safety  Ambulation/Gait Ambulation/Gait assistance: Min guard Gait Distance (Feet): 200 Feet Assistive device: Rolling walker (2 wheeled) Gait Pattern/deviations: Step-to pattern;Step-through pattern;Decreased step length - right;Decreased step length - left;Decreased stride length;Decreased weight shift to right Gait velocity: decreased Gait velocity interpretation: 1.31 - 2.62 ft/sec, indicative of limited community ambulator General Gait Details: pt steady with RW,  using more of a step-through gait pattern   Stairs             Wheelchair Mobility    Modified Rankin (Stroke Patients Only)       Balance Overall balance assessment: Needs assistance Sitting-balance support: Feet supported Sitting balance-Leahy Scale: Good     Standing balance support: During functional activity;Bilateral upper extremity supported;Single extremity supported Standing balance-Leahy Scale: Poor                              Cognition Arousal/Alertness: Awake/alert Behavior During Therapy: WFL for tasks assessed/performed Overall Cognitive Status: Within Functional Limits for tasks assessed                                        Exercises Total Joint Exercises Hip ABduction/ADduction: AROM;Strengthening;Right;10 reps;Standing Long Arc Quad: AROM;Strengthening;Right;10 reps;Seated General Exercises - Lower Extremity Mini-Sqauts: AROM;Strengthening;Both;10 reps;Standing    General Comments        Pertinent Vitals/Pain Pain Assessment: 0-10 Pain Score: 6  Pain Location: R hip Pain Descriptors / Indicators: Sore;Burning Pain Intervention(s): Monitored during session;Repositioned    Home Living                      Prior Function            PT Goals (current goals can now be found in the care plan section) Acute Rehab PT Goals PT Goal Formulation: With patient Time For Goal Achievement: 02/10/18 Potential to Achieve Goals: Good Progress towards PT goals: Progressing toward goals    Frequency  7X/week      PT Plan Current plan remains appropriate    Co-evaluation              AM-PAC PT "6 Clicks" Daily Activity  Outcome Measure  Difficulty turning over in bed (including adjusting bedclothes, sheets and blankets)?: None Difficulty moving from lying on back to sitting on the side of the bed? : None Difficulty sitting down on and standing up from a chair with arms (e.g., wheelchair,  bedside commode, etc,.)?: Unable Help needed moving to and from a bed to chair (including a wheelchair)?: A Little Help needed walking in hospital room?: A Little Help needed climbing 3-5 steps with a railing? : A Little 6 Click Score: 18    End of Session Equipment Utilized During Treatment: Gait belt Activity Tolerance: Patient tolerated treatment well Patient left: in bed;with call bell/phone within reach Nurse Communication: Mobility status PT Visit Diagnosis: Other abnormalities of gait and mobility (R26.89);Pain Pain - Right/Left: Right Pain - part of body: Hip     Time: 9675-9163 PT Time Calculation (min) (ACUTE ONLY): 14 min  Charges:  $Gait Training: 8-22 mins                     [place], [place], [place] 01/27/2018, 3:36 PM

## 2018-01-27 NOTE — Progress Notes (Signed)
Pt received scheduled tylenol 500mg  at 05:28, at 05:59 oral temp=102.1 F. Adjusted room temp, encouraged incentive spirometer, rechecked temp at 06:29 temp=101.3 F. MD notified, will continue to monitor.

## 2018-01-27 NOTE — Plan of Care (Signed)
  Problem: Education: Goal: Knowledge of General Education information will improve Description: Including pain rating scale, medication(s)/side effects and non-pharmacologic comfort measures Outcome: Progressing   Problem: Pain Managment: Goal: General experience of comfort will improve Outcome: Progressing   Problem: Safety: Goal: Ability to remain free from injury will improve Outcome: Progressing   

## 2018-01-27 NOTE — Plan of Care (Signed)
  Problem: Activity: Goal: Risk for activity intolerance will decrease Outcome: Progressing   Problem: Nutrition: Goal: Adequate nutrition will be maintained Outcome: Progressing   Problem: Pain Managment: Goal: General experience of comfort will improve Outcome: Progressing   Problem: Safety: Goal: Ability to remain free from injury will improve Outcome: Progressing   

## 2018-01-28 MED ORDER — HYDROCODONE-ACETAMINOPHEN 7.5-325 MG PO TABS
1.0000 | ORAL_TABLET | ORAL | 0 refills | Status: AC | PRN
Start: 2018-01-28 — End: ?

## 2018-01-28 MED ORDER — BISACODYL 5 MG PO TBEC: 5.0000 mg | DELAYED_RELEASE_TABLET | Freq: Every day | ORAL | 0 refills | Status: AC | PRN

## 2018-01-28 MED ORDER — ASPIRIN 325 MG PO TBEC: 325.0000 mg | DELAYED_RELEASE_TABLET | Freq: Two times a day (BID) | ORAL | 0 refills | Status: AC

## 2018-01-28 MED ORDER — TIZANIDINE HCL 4 MG PO TABS: 4.0000 mg | ORAL_TABLET | Freq: Four times a day (QID) | ORAL | 1 refills | Status: AC | PRN

## 2018-01-28 MED ORDER — DOCUSATE SODIUM 100 MG PO CAPS: 100.0000 mg | ORAL_CAPSULE | Freq: Two times a day (BID) | ORAL | 0 refills | Status: AC

## 2018-01-28 NOTE — Progress Notes (Signed)
Physical Therapy Treatment Patient Details Name: Stephen Rodgers MRN: [id number] DOB: March 16, 1956 Today's Date: 01/28/2018    History of Present Illness Pt is a 62 y/o male s/p R THA anterior approach. PMH including but not limited to pre-diabetes, cancer, DJD.    PT Comments    Pt making steady progress with functional mobility and successfully completed stair training during session as well. Pt is ready to d/c home from a PT perspective. Will continue to follow acutely. Pt would continue to benefit from skilled physical therapy services at this time while admitted and after d/c to address the below listed limitations in order to improve overall safety and independence with functional mobility.    Follow Up Recommendations  Home health PT;Supervision/Assistance - 24 hour     Equipment Recommendations  None recommended by PT    Recommendations for Other Services       Precautions / Restrictions Precautions Precautions: Fall Restrictions Weight Bearing Restrictions: Yes RLE Weight Bearing: Weight bearing as tolerated    Mobility  Bed Mobility Overal bed mobility: Needs Assistance Bed Mobility: Supine to Sit     Supine to sit: Supervision     General bed mobility comments: increased time and effort; pt used bilateral UEs to assist R LE off of bed  Transfers Overall transfer level: Needs assistance Equipment used: Rolling walker (2 wheeled) Transfers: Sit to/from Stand Sit to Stand: Supervision         General transfer comment: good technique, increased time, supervision for safety  Ambulation/Gait Ambulation/Gait assistance: Min guard Gait Distance (Feet): 250 Feet Assistive device: Rolling walker (2 wheeled) Gait Pattern/deviations: Step-through pattern;Decreased step length - right;Decreased step length - left;Decreased stride length Gait velocity: decreased Gait velocity interpretation: 1.31 - 2.62 ft/sec, indicative of limited community ambulator General  Gait Details: pt steady with RW, no LOB or need for physical assistance, improving with step-through gait pattern and more even step length bilaterally   Stairs Stairs: Yes Stairs assistance: Min guard Stair Management: No rails;Step to pattern;Backwards;With walker Number of Stairs: 1 General stair comments: cueing for technique, min guard for safety   Wheelchair Mobility    Modified Rankin (Stroke Patients Only)       Balance Overall balance assessment: Needs assistance Sitting-balance support: Feet supported Sitting balance-Leahy Scale: Good     Standing balance support: During functional activity;Bilateral upper extremity supported;Single extremity supported Standing balance-Leahy Scale: Poor                              Cognition Arousal/Alertness: Awake/alert Behavior During Therapy: WFL for tasks assessed/performed Overall Cognitive Status: Within Functional Limits for tasks assessed                                        Exercises      General Comments        Pertinent Vitals/Pain Pain Assessment: 0-10 Pain Score: 5  Pain Location: R hip Pain Descriptors / Indicators: Sore;Burning Pain Intervention(s): Monitored during session;Repositioned    Home Living                      Prior Function            PT Goals (current goals can now be found in the care plan section) Acute Rehab PT Goals PT Goal Formulation: With patient Time For  Goal Achievement: 02/10/18 Potential to Achieve Goals: Good Progress towards PT goals: Progressing toward goals    Frequency    7X/week      PT Plan Current plan remains appropriate    Co-evaluation              AM-PAC PT "6 Clicks" Daily Activity  Outcome Measure  Difficulty turning over in bed (including adjusting bedclothes, sheets and blankets)?: None Difficulty moving from lying on back to sitting on the side of the bed? : None Difficulty sitting down on and  standing up from a chair with arms (e.g., wheelchair, bedside commode, etc,.)?: Unable Help needed moving to and from a bed to chair (including a wheelchair)?: None Help needed walking in hospital room?: A Little Help needed climbing 3-5 steps with a railing? : A Little 6 Click Score: 19    End of Session Equipment Utilized During Treatment: Gait belt Activity Tolerance: Patient tolerated treatment well Patient left: in chair;with call bell/phone within reach Nurse Communication: Mobility status PT Visit Diagnosis: Other abnormalities of gait and mobility (R26.89);Pain Pain - Right/Left: Right Pain - part of body: Hip     Time: 0810-0824 PT Time Calculation (min) (ACUTE ONLY): 14 min  Charges:  $Gait Training: 8-22 mins                     [place], [place], [place] 01/28/2018, 10:39 AM

## 2018-01-28 NOTE — Progress Notes (Signed)
Patient discharging home today. Discharge instructions explained to patient and wife and they both verbalized understanding. Took all personal belongings. No further questions or concerns voiced.

## 2018-01-28 NOTE — Progress Notes (Signed)
Physical Therapy Treatment Patient Details Name: Stephen Rodgers MRN: [id number] DOB: [birth year] Today's Date: 01/28/2018    History of Present Illness Pt is a 62 y/o male s/p R THA anterior approach. PMH including but not limited to pre-diabetes, cancer, DJD.    PT Comments    Pt continuing to progress well with mobility. Pt would continue to benefit from skilled physical therapy services at this time while admitted and after d/c to address the below listed limitations in order to improve overall safety and independence with functional mobility.    Follow Up Recommendations  Home health PT;Supervision/Assistance - 24 hour     Equipment Recommendations  None recommended by PT    Recommendations for Other Services       Precautions / Restrictions Precautions Precautions: Fall Restrictions Weight Bearing Restrictions: Yes RLE Weight Bearing: Weight bearing as tolerated    Mobility  Bed Mobility               General bed mobility comments: pt OOB in recliner chair upon arrival  Transfers Overall transfer level: Needs assistance Equipment used: Rolling walker (2 wheeled) Transfers: Sit to/from Stand Sit to Stand: Supervision         General transfer comment: good technique, increased time, supervision for safety  Ambulation/Gait Ambulation/Gait assistance: Supervision Gait Distance (Feet): 200 Feet Assistive device: Rolling walker (2 wheeled) Gait Pattern/deviations: Step-through pattern;Decreased step length - right;Decreased step length - left;Decreased stride length Gait velocity: decreased Gait velocity interpretation: 1.31 - 2.62 ft/sec, indicative of limited community ambulator General Gait Details: pt steady with RW, no LOB or need for physical assistance, improving with step-through gait pattern and more even step length bilaterally   Stairs             Wheelchair Mobility    Modified Rankin (Stroke Patients Only)       Balance Overall  balance assessment: Needs assistance Sitting-balance support: Feet supported Sitting balance-Leahy Scale: Good     Standing balance support: During functional activity;Bilateral upper extremity supported;Single extremity supported Standing balance-Leahy Scale: Poor                              Cognition Arousal/Alertness: Awake/alert Behavior During Therapy: WFL for tasks assessed/performed Overall Cognitive Status: Within Functional Limits for tasks assessed                                        Exercises Total Joint Exercises Hip ABduction/ADduction: AROM;Strengthening;Right;10 reps;Standing Long Arc Quad: AROM;Strengthening;Right;10 reps;Seated Knee Flexion: AROM;Strengthening;Right;10 reps;Standing Marching in Standing: Seated;Right;10 reps;AAROM Standing Hip Extension: AROM;Strengthening;Right;10 reps;Standing General Exercises - Lower Extremity Mini-Sqauts: Strengthening;10 reps;Standing    General Comments        Pertinent Vitals/Pain Pain Assessment: 0-10 Pain Score: 7  Pain Location: R hip Pain Descriptors / Indicators: Sore;Burning Pain Intervention(s): Monitored during session;Repositioned    Home Living                      Prior Function            PT Goals (current goals can now be found in the care plan section) Acute Rehab PT Goals PT Goal Formulation: With patient Time For Goal Achievement: 02/10/18 Potential to Achieve Goals: Good Progress towards PT goals: Progressing toward goals    Frequency    7X/week  PT Plan Current plan remains appropriate    Co-evaluation              AM-PAC PT "6 Clicks" Daily Activity  Outcome Measure  Difficulty turning over in bed (including adjusting bedclothes, sheets and blankets)?: None Difficulty moving from lying on back to sitting on the side of the bed? : None Difficulty sitting down on and standing up from a chair with arms (e.g., wheelchair,  bedside commode, etc,.)?: Unable Help needed moving to and from a bed to chair (including a wheelchair)?: None Help needed walking in hospital room?: A Little Help needed climbing 3-5 steps with a railing? : A Little 6 Click Score: 19    End of Session   Activity Tolerance: Patient tolerated treatment well Patient left: with call bell/phone within reach;with family/visitor present;Other (comment)(entering bathroom) Nurse Communication: Mobility status PT Visit Diagnosis: Other abnormalities of gait and mobility (R26.89);Pain Pain - Right/Left: Right Pain - part of body: Hip     Time: 6681-5947 PT Time Calculation (min) (ACUTE ONLY): 14 min  Charges:  $Therapeutic Exercise: 8-22 mins                     [place], [place], [place] 01/28/2018, 3:42 PM

## 2018-01-28 NOTE — Care Management Note (Addendum)
Case Management Note  Patient Details  Name: Stephen Rodgers MRN: [id number] Date of Birth: 1955-11-19  Subjective/Objective:  62 yr old gentleman s/p rght total hip arthroplasty.                  Action/Plan: Case manager spoke with patient concerning discharge plan and DME. Patietn has RW. He is under worker's comp. Claim #[id number].  CM reached out to Stephen Rodgers, with [facility]- [phone], faxed orders, OP note, H&P, PT eval to her at [phone].   Expected Discharge Date:    01/28/18             Expected Discharge Plan:  [place]  In-House Referral:  NA  Discharge planning Services  CM Consult  Post Acute Care Choice:  Home Health Choice offered to:  Patient  DME Arranged:  N/A(has rolling walker) DME Agency:  NA  HH Arranged:  PT [place] Agency:  (to be arranged by [employer] comp)  Status of Service:  In process, will continue to follow  If discussed at Long Length of Stay Meetings, dates discussed:    Additional Comments:  Ninfa Meeker, RN 01/28/2018, 10:28 AM

## 2018-01-28 NOTE — Discharge Summary (Signed)
Patient ID: Stephen Rodgers MRN: [id number] DOB/AGE: 1955/08/26 62 y.o.  Admit date: 01/26/2018 Discharge date: 01/28/2018  Admission Diagnoses:  Principal Problem:   Primary localized osteoarthritis of right hip Active Problems:   Primary osteoarthritis of right hip   Discharge Diagnoses:  Same  Past Medical History:  Diagnosis Date  . Cancer ([place])   . Degenerative joint disease (DJD) of hip    Right  . Gout   . Hearing loss   . Pre-diabetes     Surgeries: Procedure(s): TOTAL HIP ARTHROPLASTY ANTERIOR APPROACH on 01/26/2018   Consultants:   Discharged Condition: Improved  Hospital Course: Stephen Rodgers is an 62 y.o. male who was admitted 01/26/2018 for operative treatment ofPrimary localized osteoarthritis of right hip. Patient has severe unremitting pain that affects sleep, daily activities, and work/hobbies. After pre-op clearance the patient was taken to the operating room on 01/26/2018 and underwent  Procedure(s): TOTAL HIP ARTHROPLASTY ANTERIOR APPROACH.    Patient was given perioperative antibiotics:  Anti-infectives (From admission, onward)   Start     Dose/Rate Route Frequency Ordered Stop   01/26/18 1930  ceFAZolin (ANCEF) IVPB 2g/100 mL premix     2 g 200 mL/hr over 30 Minutes Intravenous Every 6 hours 01/26/18 1716 01/27/18 0158   01/26/18 1200  ceFAZolin (ANCEF) 3 g in dextrose 5 % 50 mL IVPB     3 g 100 mL/hr over 30 Minutes Intravenous To [facility] 01/25/18 1130 01/26/18 1318       Patient was given sequential compression devices, early ambulation, and chemoprophylaxis to prevent DVT.  Patient benefited maximally from hospital stay and there were no complications.    Recent vital signs:  Patient Vitals for the past 24 hrs:  BP Temp Temp src Pulse Resp SpO2  01/28/18 0943 (!) 141/77 98.9 F (37.2 C) Oral (!) 103 16 97 %  01/28/18 0317 133/75 98.9 F (37.2 C) Oral 91 18 93 %  01/27/18 2008 (!) 172/86 98.5 F (36.9 C) - (!) 101 16 94 %   01/27/18 1345 138/81 99.1 F (37.3 C) Oral 94 16 94 %     Recent laboratory studies: No results for input(s): WBC, HGB, HCT, PLT, NA, K, CL, CO2, BUN, CREATININE, GLUCOSE, INR, CALCIUM in the last 72 hours.  Invalid input(s): PT, 2   Discharge Medications:   Allergies as of 01/28/2018      Reactions   Peanut-containing Drug Products    UNSPECIFIED REACTION       Medication List    STOP taking these medications   ibuprofen 200 MG tablet Commonly known as:  ADVIL,MOTRIN     TAKE these medications   allopurinol 300 MG tablet Commonly known as:  ZYLOPRIM Take 300 mg by mouth daily.   aspirin 325 MG EC tablet Take 1 tablet (325 mg total) by mouth 2 (two) times daily after a meal.   bisacodyl 5 MG EC tablet Commonly known as:  DULCOLAX Take 1 tablet (5 mg total) by mouth daily as needed for moderate constipation.   docusate sodium 100 MG capsule Commonly known as:  COLACE Take 1 capsule (100 mg total) by mouth 2 (two) times daily.   gabapentin 300 MG capsule Commonly known as:  NEURONTIN TAKE 1 CAPSULE TWICE A DAY (SCHEDULE FOLLOW UP APPOINTMENT)   gemfibrozil 600 MG tablet Commonly known as:  LOPID Take 600 mg by mouth daily.   HYDROcodone-acetaminophen 7.5-325 MG tablet Commonly known as:  NORCO Take 1-2 tablets by mouth every 4 (  four) hours as needed for severe pain (pain score 7-10).   tiZANidine 4 MG tablet Commonly known as:  ZANAFLEX Take 1 tablet (4 mg total) by mouth every 6 (six) hours as needed.   vitamin B-12 1000 MCG tablet Commonly known as:  CYANOCOBALAMIN Take 1,000 mcg by mouth daily.            Durable Medical Equipment  (From admission, onward)        Start     Ordered   01/26/18 1717  DME Walker rolling  Once    Question:  Patient needs a walker to treat with the following condition  Answer:  Primary osteoarthritis of right hip   01/26/18 1716   01/26/18 1717  DME 3 n 1  Once     01/26/18 1716   01/26/18 1717  DME Bedside  commode  Once    Question:  Patient needs a bedside commode to treat with the following condition  Answer:  Primary osteoarthritis of right hip   01/26/18 1716      Diagnostic Studies: Dg Chest 2 View  Result Date: 01/15/2018 CLINICAL DATA:  Preop exam. EXAM: CHEST - 2 VIEW COMPARISON:  None FINDINGS: Normal heart size. No pleural effusion or edema. No airspace opacities. Review of the visualized osseous structures is unremarkable. IMPRESSION: 1. No active cardiopulmonary abnormalities. Electronically Signed   By: Kerby Moors M.D.   On: 01/15/2018 14:21   Dg C-arm 1-60 Min  Result Date: 01/26/2018 CLINICAL DATA:  Status post right total hip arthroplasty. EXAM: DG C-ARM 61-120 MIN; OPERATIVE RIGHT HIP WITH PELVIS FLUOROSCOPY TIME:  39 seconds. COMPARISON:  Radiographs of July 17, 2017. FINDINGS: Two intraoperative fluoroscopic images of the right hip demonstrate the acetabular and femoral components to be well position. No fracture or dislocation is noted. IMPRESSION: Status post right total hip arthroplasty. Electronically Signed   By: Marijo Conception, M.D.   On: 01/26/2018 15:09   Dg C-arm 1-60 Min  Result Date: 01/26/2018 CLINICAL DATA:  Status post right total hip arthroplasty. EXAM: DG C-ARM 61-120 MIN; OPERATIVE RIGHT HIP WITH PELVIS FLUOROSCOPY TIME:  39 seconds. COMPARISON:  Radiographs of July 17, 2017. FINDINGS: Two intraoperative fluoroscopic images of the right hip demonstrate the acetabular and femoral components to be well position. No fracture or dislocation is noted. IMPRESSION: Status post right total hip arthroplasty. Electronically Signed   By: Marijo Conception, M.D.   On: 01/26/2018 15:09   Dg Hip Operative Unilat W Or W/o Pelvis Right  Result Date: 01/26/2018 CLINICAL DATA:  Status post right total hip arthroplasty. EXAM: DG C-ARM 61-120 MIN; OPERATIVE RIGHT HIP WITH PELVIS FLUOROSCOPY TIME:  39 seconds. COMPARISON:  Radiographs of July 17, 2017. FINDINGS: Two  intraoperative fluoroscopic images of the right hip demonstrate the acetabular and femoral components to be well position. No fracture or dislocation is noted. IMPRESSION: Status post right total hip arthroplasty. Electronically Signed   By: Marijo Conception, M.D.   On: 01/26/2018 15:09    Disposition: Discharge disposition: 01-Home or Self Care       Discharge Instructions    Call MD / Call 911   Complete by:  As directed    If you experience chest pain or shortness of breath, CALL 911 and be transported to the hospital emergency room.  If you develope a fever above 101 F, pus (white drainage) or increased drainage or redness at the wound, or calf pain, call your surgeon's office.   Constipation  Prevention   Complete by:  As directed    Drink plenty of fluids.  Prune juice may be helpful.  You may use a stool softener, such as Colace (over the counter) 100 mg twice a day.  Use MiraLax (over the counter) for constipation as needed.   Diet - low sodium heart healthy   Complete by:  As directed    Discharge instructions   Complete by:  As directed    INSTRUCTIONS AFTER JOINT REPLACEMENT   Remove items at home which could result in a fall. This includes throw rugs or furniture in walking pathways ICE to the affected joint every three hours while awake for 30 minutes at a time, for at least the first 3-5 days, and then as needed for pain and swelling.  Continue to use ice for pain and swelling. You may notice swelling that will progress down to the foot and ankle.  This is normal after surgery.  Elevate your leg when you are not up walking on it.   Continue to use the breathing machine you got in the hospital (incentive spirometer) which will help keep your temperature down.  It is common for your temperature to cycle up and down following surgery, especially at night when you are not up moving around and exerting yourself.  The breathing machine keeps your lungs expanded and your temperature  down.   DIET:  As you were doing prior to hospitalization, we recommend a well-balanced diet.  DRESSING / WOUND CARE / SHOWERING  You may shower 3 days after surgery, but keep the wounds dry during showering.  You may use an occlusive plastic wrap (Press'n Seal for example), NO SOAKING/SUBMERGING IN THE BATHTUB.  If the bandage gets wet, change with a clean dry gauze.  If the incision gets wet, pat the wound dry with a clean towel.  ACTIVITY  Increase activity slowly as tolerated, but follow the weight bearing instructions below.   No driving for 6 weeks or until further direction given by your physician.  You cannot drive while taking narcotics.  No lifting or carrying greater than 10 lbs. until further directed by your surgeon. Avoid periods of inactivity such as sitting longer than an hour when not asleep. This helps prevent blood clots.  You may return to work once you are authorized by your doctor.     WEIGHT BEARING   Weight bearing as tolerated with assist device (walker, cane, etc) as directed, use it as long as suggested by your surgeon or therapist, typically at least 4-6 weeks.   EXERCISES  Results after joint replacement surgery are often greatly improved when you follow the exercise, range of motion and muscle strengthening exercises prescribed by your doctor. Safety measures are also important to protect the joint from further injury. Any time any of these exercises cause you to have increased pain or swelling, decrease what you are doing until you are comfortable again and then slowly increase them. If you have problems or questions, call your caregiver or physical therapist for advice.   Rehabilitation is important following a joint replacement. After just a few days of immobilization, the muscles of the leg can become weakened and shrink (atrophy).  These exercises are designed to build up the tone and strength of the thigh and leg muscles and to improve motion. Often  times heat used for twenty to thirty minutes before working out will loosen up your tissues and help with improving the range of motion but do not  use heat for the first two weeks following surgery (sometimes heat can increase post-operative swelling).   These exercises can be done on a training (exercise) mat, on the floor, on a table or on a bed. Use whatever works the best and is most comfortable for you.    Use music or television while you are exercising so that the exercises are a pleasant break in your day. This will make your life better with the exercises acting as a break in your routine that you can look forward to.   Perform all exercises about fifteen times, three times per day or as directed.  You should exercise both the operative leg and the other leg as well.   Exercises include:   Quad Sets - Tighten up the muscle on the front of the thigh (Quad) and hold for 5-10 seconds.   Straight Leg Raises - With your knee straight (if you were given a brace, keep it on), lift the leg to 60 degrees, hold for 3 seconds, and slowly lower the leg.  Perform this exercise against resistance later as your leg gets stronger.  Leg Slides: Lying on your back, slowly slide your foot toward your buttocks, bending your knee up off the floor (only go as far as is comfortable). Then slowly slide your foot back down until your leg is flat on the floor again.  Angel Wings: Lying on your back spread your legs to the side as far apart as you can without causing discomfort.  Hamstring Strength:  Lying on your back, push your heel against the floor with your leg straight by tightening up the muscles of your buttocks.  Repeat, but this time bend your knee to a comfortable angle, and push your heel against the floor.  You may put a pillow under the heel to make it more comfortable if necessary.   A rehabilitation program following joint replacement surgery can speed recovery and prevent re-injury in the future due to  weakened muscles. Contact your doctor or a physical therapist for more information on knee rehabilitation.    CONSTIPATION  Constipation is defined medically as fewer than three stools per week and severe constipation as less than one stool per week.  Even if you have a regular bowel pattern at home, your normal regimen is likely to be disrupted due to multiple reasons following surgery.  Combination of anesthesia, postoperative narcotics, change in appetite and fluid intake all can affect your bowels.   YOU MUST use at least one of the following options; they are listed in order of increasing strength to get the job done.  They are all available over the counter, and you may need to use some, POSSIBLY even all of these options:    Drink plenty of fluids (prune juice may be helpful) and high fiber foods Colace 100 mg by mouth twice a day  Senokot for constipation as directed and as needed Dulcolax (bisacodyl), take with full glass of water  Miralax (polyethylene glycol) once or twice a day as needed.  If you have tried all these things and are unable to have a bowel movement in the first 3-4 days after surgery call either your surgeon or your primary doctor.    If you experience loose stools or diarrhea, hold the medications until you stool forms back up.  If your symptoms do not get better within 1 week or if they get worse, check with your doctor.  If you experience "the worst abdominal pain ever"  or develop nausea or vomiting, please contact the office immediately for further recommendations for treatment.   ITCHING:  If you experience itching with your medications, try taking only a single pain pill, or even half a pain pill at a time.  You can also use Benadryl over the counter for itching or also to help with sleep.   TED HOSE STOCKINGS:  Use stockings on both legs until for at least 2 weeks or as directed by physician office. They may be removed at night for sleeping.  MEDICATIONS:  See  your medication summary on the "After Visit Summary" that nursing will review with you.  You may have some home medications which will be placed on hold until you complete the course of blood thinner medication.  It is important for you to complete the blood thinner medication as prescribed.  PRECAUTIONS:  If you experience chest pain or shortness of breath - call 911 immediately for transfer to the hospital emergency department.   If you develop a fever greater that 101 F, purulent drainage from wound, increased redness or drainage from wound, foul odor from the wound/dressing, or calf pain - CONTACT YOUR SURGEON.                                                   FOLLOW-UP APPOINTMENTS:  If you do not already have a post-op appointment, please call the office for an appointment to be seen by your surgeon.  Guidelines for how soon to be seen are listed in your "After Visit Summary", but are typically between 1-4 weeks after surgery.  OTHER INSTRUCTIONS:   Knee Replacement:  Do not place pillow under knee, focus on keeping the knee straight while resting. CPM instructions: 0-90 degrees, 2 hours in the morning, 2 hours in the afternoon, and 2 hours in the evening. Place foam block, curve side up under heel at all times except when in CPM or when walking.  DO NOT modify, tear, cut, or change the foam block in any way.  MAKE SURE YOU:  Understand these instructions.  Get help right away if you are not doing well or get worse.    Thank you for letting us be a part of your medical care team.  It is a privilege we respect greatly.  We hope these instructions will help you stay on track for a fast and full recovery!   Increase activity slowly as tolerated   Complete by:  As directed       Follow-up Information    Melrose Nakayama, MD. Schedule an appointment as soon as possible for a visit in 2 weeks.   Specialty:  Orthopedic Surgery Contact information: [address] [place]  [phone]        Worker's Comp Follow up.   Why:  Someone from [employer] will contact you to inform you which Home Health agency will be coming out to provide your therapy.           Signed: Rich Fuchs 01/28/2018, 12:19 PM

## 2018-01-28 NOTE — Plan of Care (Signed)

## 2018-03-15 DIAGNOSIS — E538 Deficiency of other specified B group vitamins: Secondary | ICD-10-CM | POA: Diagnosis not present

## 2018-03-15 DIAGNOSIS — Z0000 Encounter for general adult medical examination without abnormal findings: Secondary | ICD-10-CM | POA: Diagnosis not present

## 2018-03-15 DIAGNOSIS — E119 Type 2 diabetes mellitus without complications: Secondary | ICD-10-CM | POA: Diagnosis not present

## 2018-03-15 DIAGNOSIS — M109 Gout, unspecified: Secondary | ICD-10-CM | POA: Diagnosis not present

## 2018-03-15 DIAGNOSIS — Z125 Encounter for screening for malignant neoplasm of prostate: Secondary | ICD-10-CM | POA: Diagnosis not present

## 2018-03-15 DIAGNOSIS — E782 Mixed hyperlipidemia: Secondary | ICD-10-CM | POA: Diagnosis not present

## 2018-03-15 DIAGNOSIS — Z23 Encounter for immunization: Secondary | ICD-10-CM | POA: Diagnosis not present

## 2018-06-15 DIAGNOSIS — E538 Deficiency of other specified B group vitamins: Secondary | ICD-10-CM | POA: Diagnosis not present

## 2018-09-13 DIAGNOSIS — E119 Type 2 diabetes mellitus without complications: Secondary | ICD-10-CM | POA: Diagnosis not present

## 2018-09-13 DIAGNOSIS — E538 Deficiency of other specified B group vitamins: Secondary | ICD-10-CM | POA: Diagnosis not present

## 2018-09-13 DIAGNOSIS — E782 Mixed hyperlipidemia: Secondary | ICD-10-CM | POA: Diagnosis not present

## 2019-01-13 DIAGNOSIS — H903 Sensorineural hearing loss, bilateral: Secondary | ICD-10-CM | POA: Diagnosis not present

## 2019-02-02 DIAGNOSIS — H903 Sensorineural hearing loss, bilateral: Secondary | ICD-10-CM | POA: Diagnosis not present

## 2019-04-18 DIAGNOSIS — E782 Mixed hyperlipidemia: Secondary | ICD-10-CM | POA: Diagnosis not present

## 2019-04-18 DIAGNOSIS — E538 Deficiency of other specified B group vitamins: Secondary | ICD-10-CM | POA: Diagnosis not present

## 2019-04-18 DIAGNOSIS — Z23 Encounter for immunization: Secondary | ICD-10-CM | POA: Diagnosis not present

## 2019-04-18 DIAGNOSIS — Z0000 Encounter for general adult medical examination without abnormal findings: Secondary | ICD-10-CM | POA: Diagnosis not present

## 2019-04-18 DIAGNOSIS — Z125 Encounter for screening for malignant neoplasm of prostate: Secondary | ICD-10-CM | POA: Diagnosis not present

## 2019-04-18 DIAGNOSIS — E1142 Type 2 diabetes mellitus with diabetic polyneuropathy: Secondary | ICD-10-CM | POA: Diagnosis not present

## 2019-04-18 DIAGNOSIS — Z5181 Encounter for therapeutic drug level monitoring: Secondary | ICD-10-CM | POA: Diagnosis not present

## 2019-04-20 DIAGNOSIS — H2511 Age-related nuclear cataract, right eye: Secondary | ICD-10-CM | POA: Diagnosis not present

## 2019-04-20 DIAGNOSIS — Z01818 Encounter for other preprocedural examination: Secondary | ICD-10-CM | POA: Diagnosis not present

## 2019-05-02 DIAGNOSIS — H2511 Age-related nuclear cataract, right eye: Secondary | ICD-10-CM | POA: Diagnosis not present

## 2019-05-02 DIAGNOSIS — H25811 Combined forms of age-related cataract, right eye: Secondary | ICD-10-CM | POA: Diagnosis not present

## 2020-07-10 IMAGING — CR DG CHEST 2V
2 series · 2 of 2 positions shown · non-contrast
Comparison: None

CLINICAL DATA: Preop exam.

EXAM:
CHEST - 2 VIEW

[w chest pa]
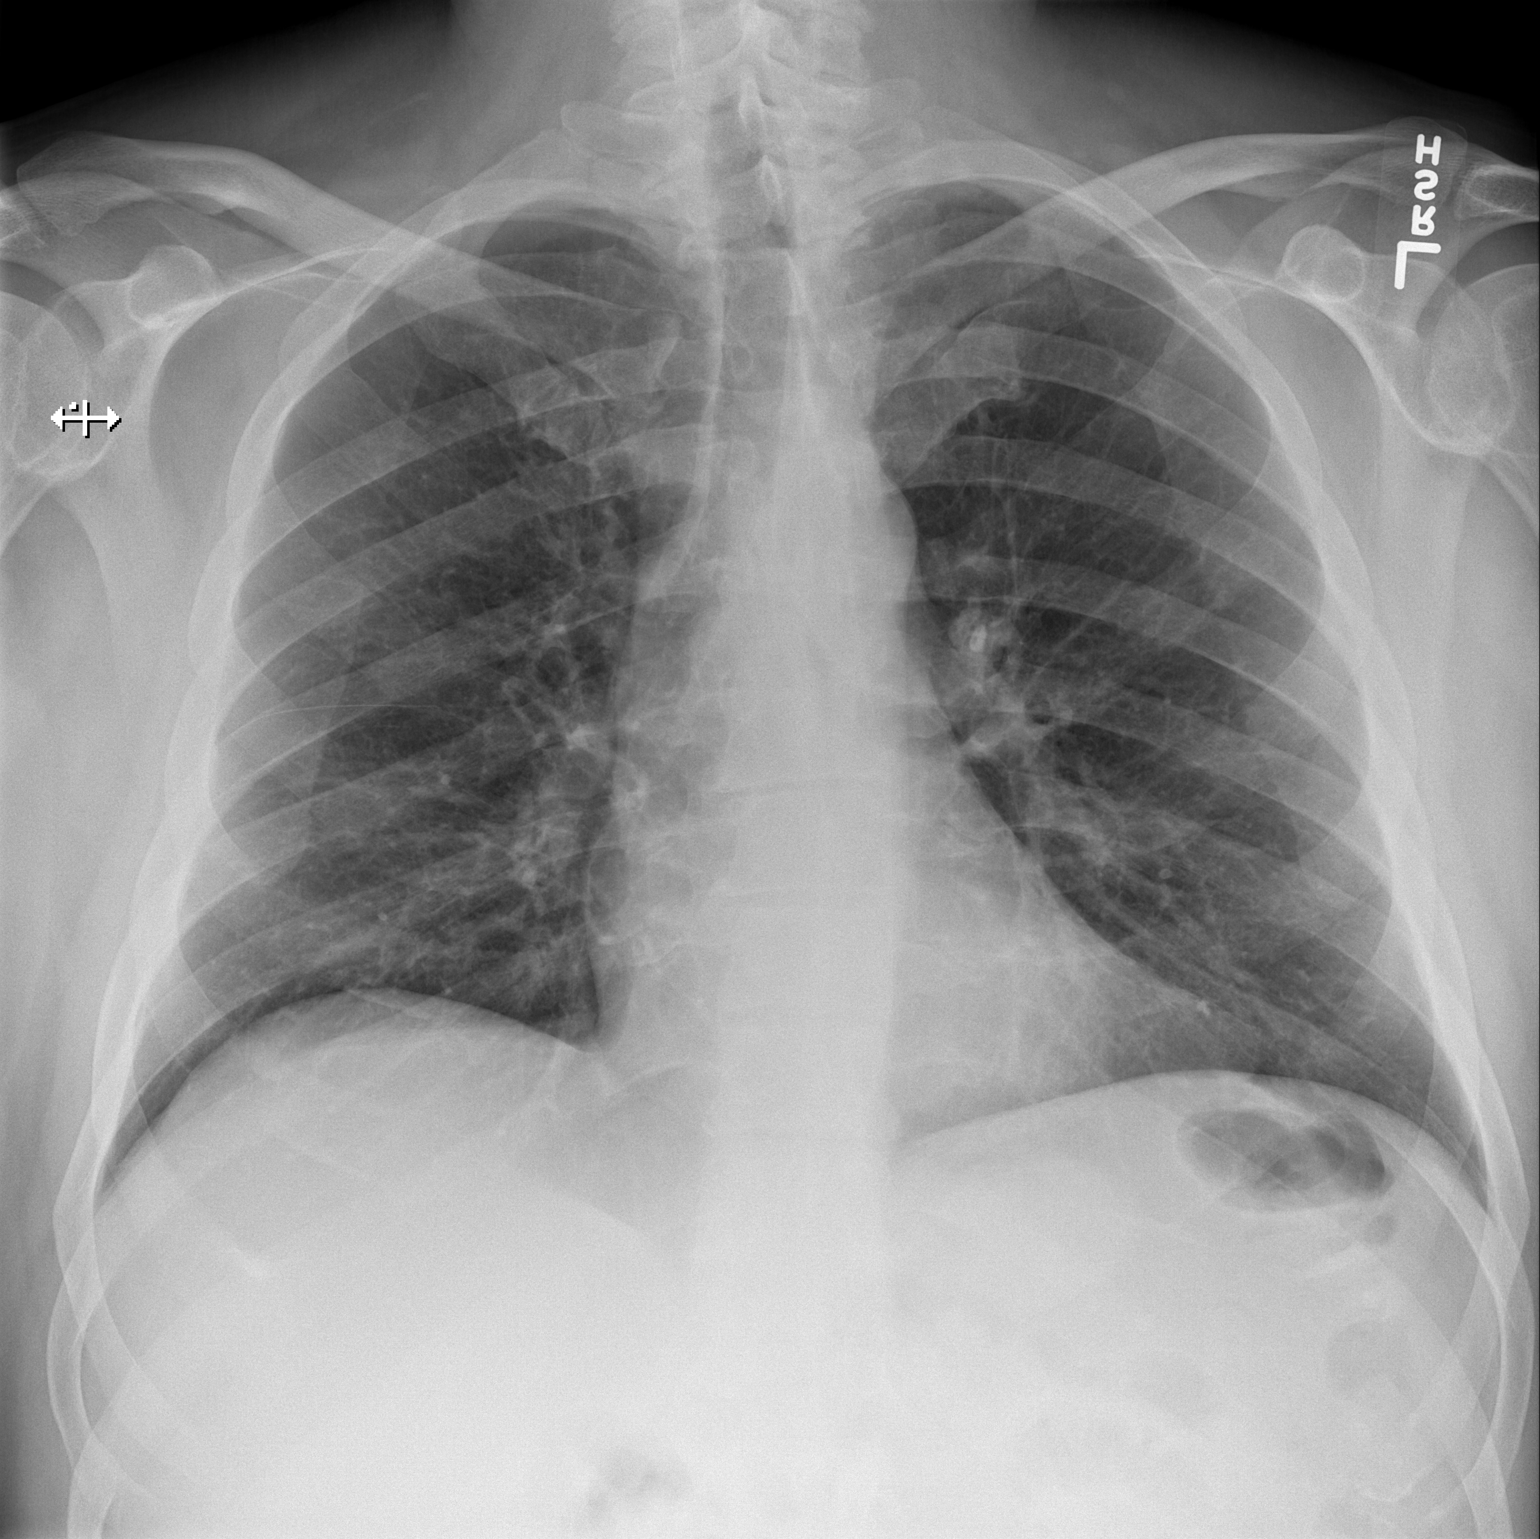

[w chest lat]
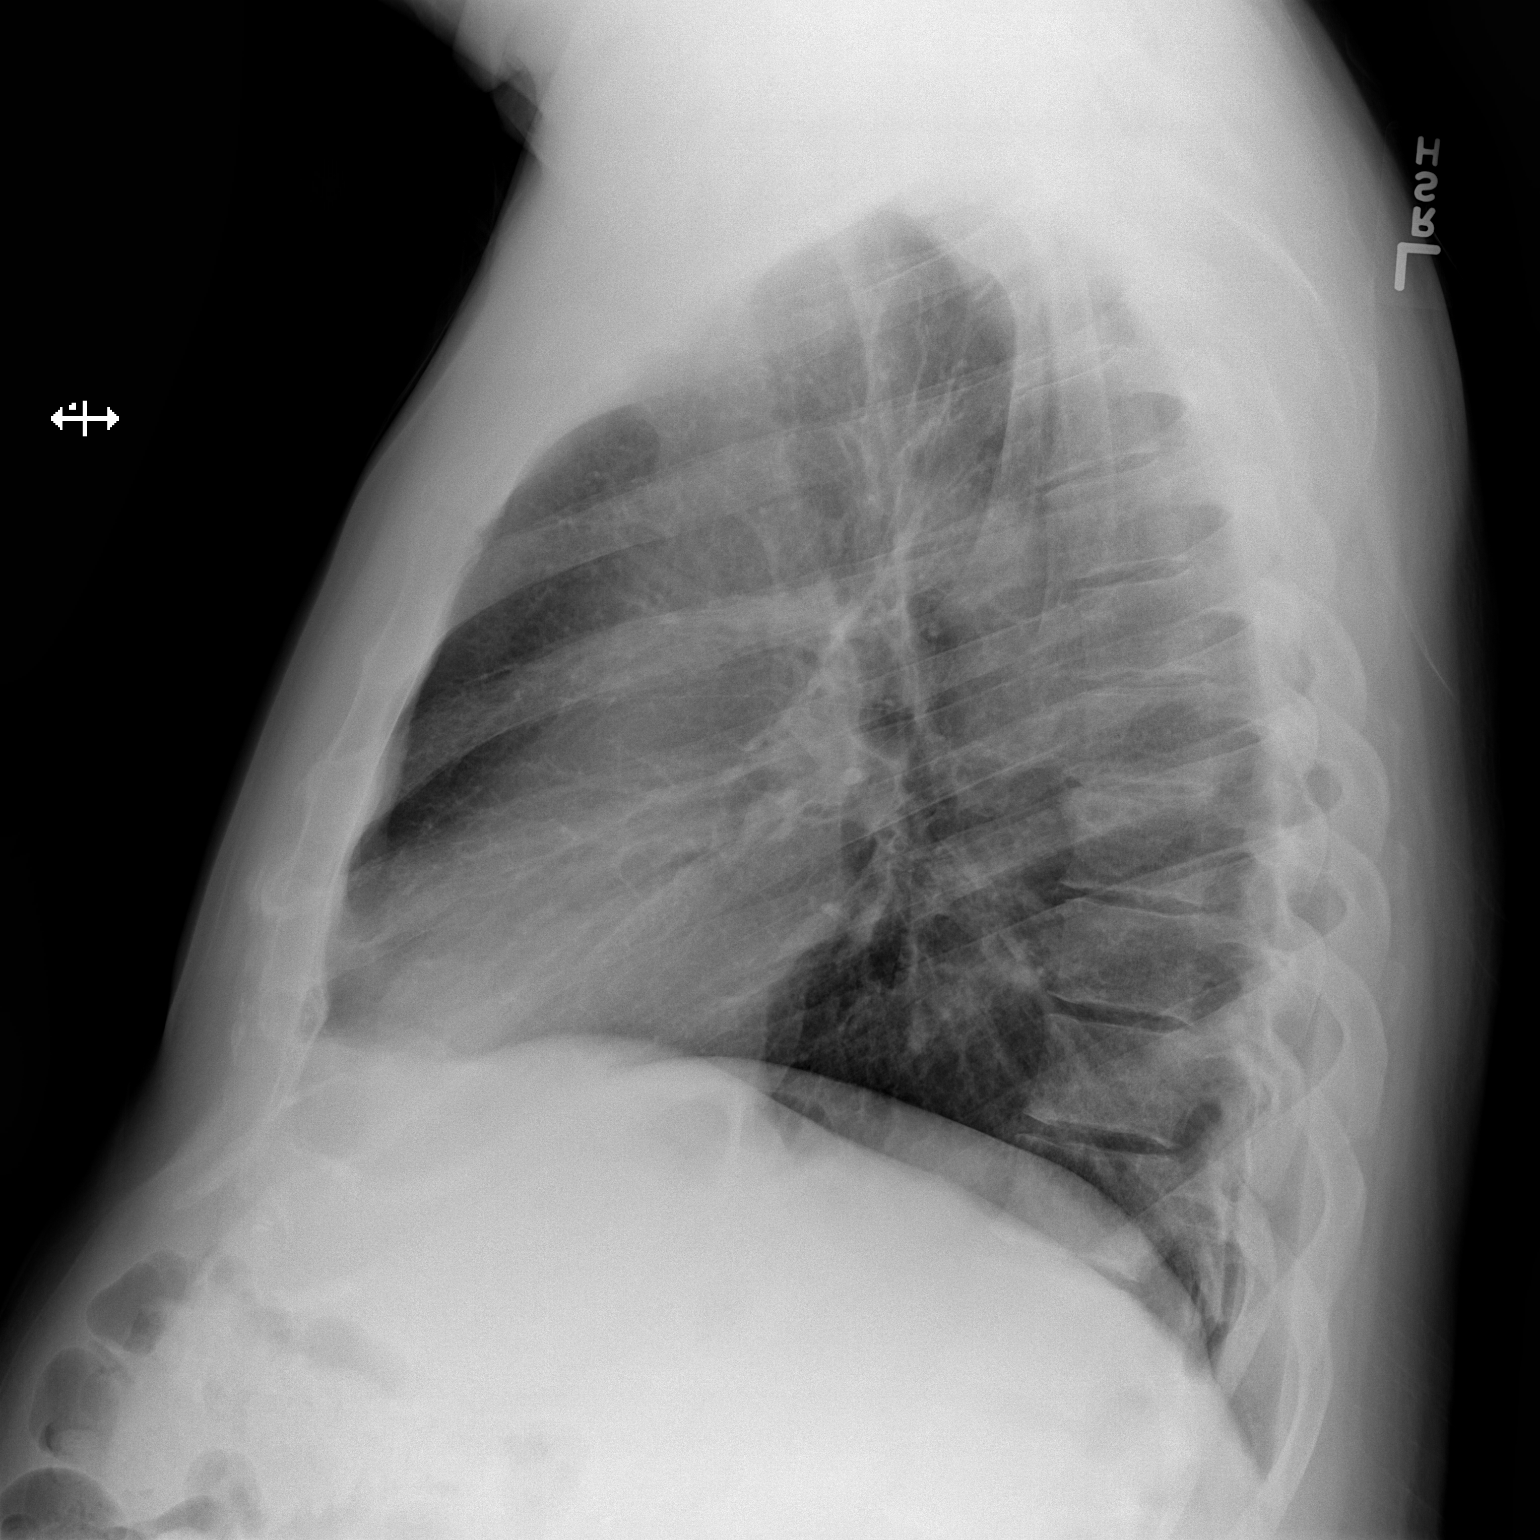

[2 of 2 positions shown; findings below may reference images not displayed]

FINDINGS: Normal heart size. No pleural effusion or edema. No airspace
opacities. Review of the visualized osseous structures is
unremarkable.
IMPRESSION: 1. No active cardiopulmonary abnormalities.

## 2020-07-21 IMAGING — RF DG C-ARM 61-120 MIN
1 series · 2 of 2 positions shown · non-contrast
Comparison: Radiographs July 17, 2017.

CLINICAL DATA: Status post right total hip arthroplasty.

EXAM:
DG C-ARM 61-120 MIN; OPERATIVE RIGHT HIP WITH PELVIS
FLUOROSCOPY TIME:  39 seconds.

[Series 1: run · 2 of 2 slices shown]
[im 1/2]
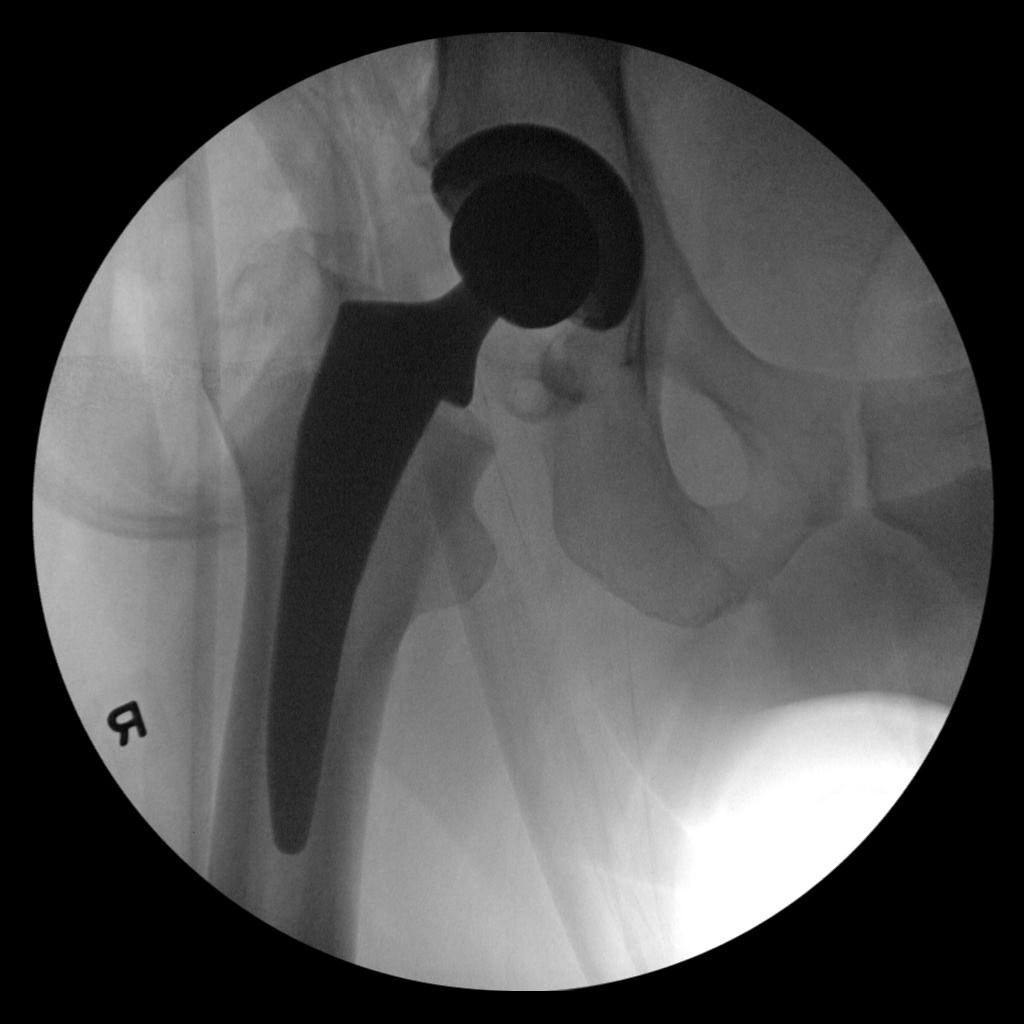
[im 2/2]
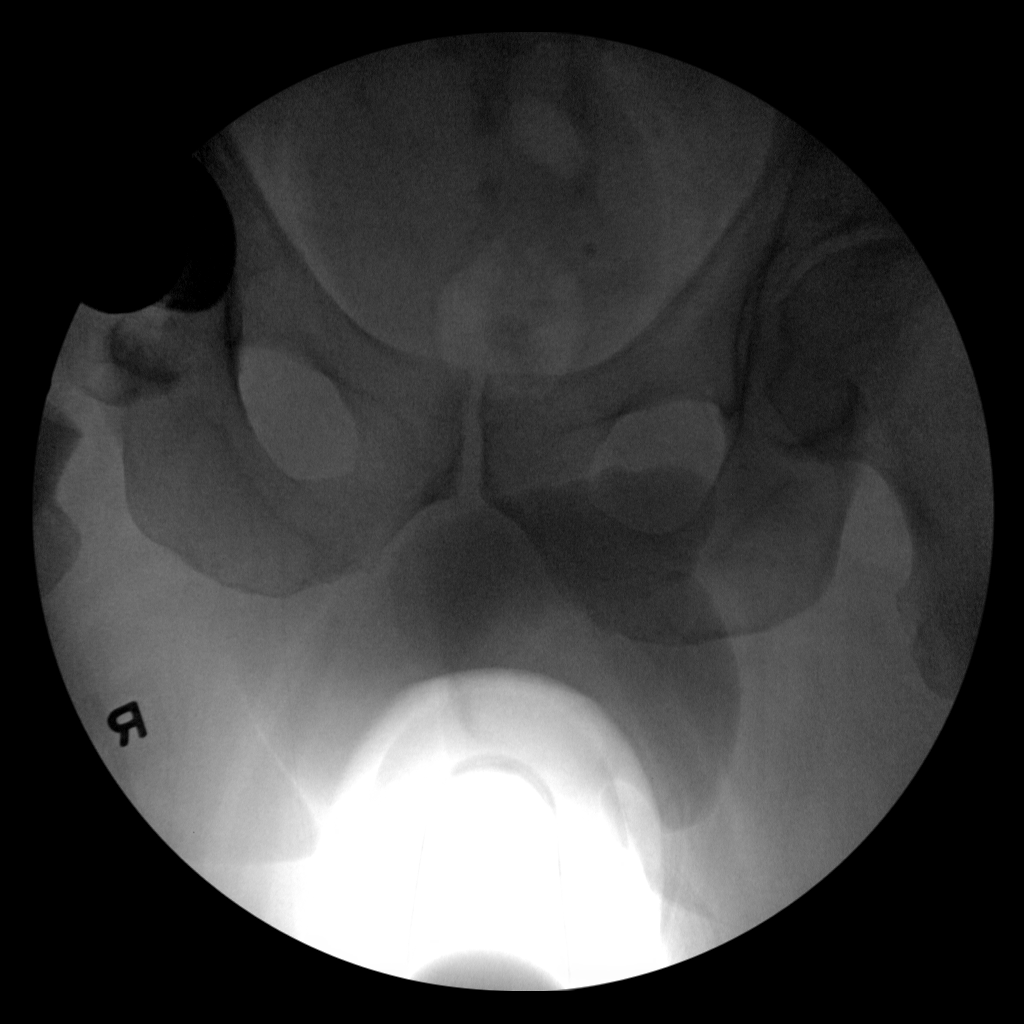

[2 of 2 positions shown; findings below may reference images not displayed]

FINDINGS: Two intraoperative fluoroscopic images of the right hip demonstrate
the acetabular and femoral components to be well position. No
fracture or dislocation is noted.
IMPRESSION: Status post right total hip arthroplasty.

## 2023-12-12 ENCOUNTER — Emergency Department (HOSPITAL_BASED_OUTPATIENT_CLINIC_OR_DEPARTMENT_OTHER)

## 2023-12-12 ENCOUNTER — Encounter (HOSPITAL_BASED_OUTPATIENT_CLINIC_OR_DEPARTMENT_OTHER): Payer: Self-pay | Admitting: Emergency Medicine

## 2023-12-12 ENCOUNTER — Emergency Department (HOSPITAL_BASED_OUTPATIENT_CLINIC_OR_DEPARTMENT_OTHER)
Admission: EM | Admit: 2023-12-12 | Discharge: 2023-12-12 | Disposition: A | Discharge status: Home / Self Care | Attending: Emergency Medicine | Admitting: Emergency Medicine

## 2023-12-12 ENCOUNTER — Other Ambulatory Visit: Payer: Self-pay

## 2023-12-12 DIAGNOSIS — L03115 Cellulitis of right lower limb: Secondary | ICD-10-CM | POA: Diagnosis not present

## 2023-12-12 DIAGNOSIS — Z7982 Long term (current) use of aspirin: Secondary | ICD-10-CM | POA: Insufficient documentation

## 2023-12-12 DIAGNOSIS — Z7984 Long term (current) use of oral hypoglycemic drugs: Secondary | ICD-10-CM | POA: Insufficient documentation

## 2023-12-12 DIAGNOSIS — Z91010 Allergy to peanuts: Secondary | ICD-10-CM | POA: Insufficient documentation

## 2023-12-12 DIAGNOSIS — E1140 Type 2 diabetes mellitus with diabetic neuropathy, unspecified: Secondary | ICD-10-CM | POA: Diagnosis not present

## 2023-12-12 DIAGNOSIS — M79671 Pain in right foot: Secondary | ICD-10-CM | POA: Diagnosis present

## 2023-12-12 MED ORDER — CIPROFLOXACIN HCL 500 MG PO TABS
500.0000 mg | ORAL_TABLET | Freq: Once | ORAL | Status: AC
  Administered 2023-12-12: 500 mg via ORAL
  Filled 2023-12-12: qty 1

## 2023-12-12 MED ORDER — CIPROFLOXACIN HCL 500 MG PO TABS: 500.0000 mg | ORAL_TABLET | Freq: Two times a day (BID) | ORAL | 0 refills | Status: AC

## 2023-12-12 NOTE — ED Triage Notes (Signed)
 Pt c/o swelling and redness to RT foot x several days; hx of neuropathy; thinks he stepped on some metal this week

## 2023-12-12 NOTE — ED Provider Notes (Signed)
 [facility] [place] Provider Note   CSN: [id number] Arrival date & time: 12/12/23  1539     Patient presents with: Foot Swelling   Stephen Rodgers is a 68 y.o. male.   86 yo M with a cc of right foot pain and swelling.  This has been going on for couple days now.  He stepped on a nail at work and he thinks that is the cause of it.  Has had trouble with cellulitis in the past.  Is a diabetic and has diabetic neuropathy.        Prior to Admission medications   Medication Sig Start Date End Date Taking? Authorizing Provider  ciprofloxacin (CIPRO) 500 MG tablet Take 1 tablet (500 mg total) by mouth every 12 (twelve) hours. 12/12/23  Yes Albertus Hughs, DO  allopurinol  (ZYLOPRIM ) 300 MG tablet Take 300 mg by mouth daily.    [provider]  aspirin  EC 325 MG EC tablet Take 1 tablet (325 mg total) by mouth 2 (two) times daily after a meal. 01/28/18   Lucrezia Sachs, PA-C  bisacodyl  (DULCOLAX) 5 MG EC tablet Take 1 tablet (5 mg total) by mouth daily as needed for moderate constipation. 01/28/18   Lucrezia Sachs, PA-C  docusate sodium  (COLACE) 100 MG capsule Take 1 capsule (100 mg total) by mouth 2 (two) times daily. 01/28/18   Nida, Andrew, PA-C  gabapentin  (NEURONTIN ) 300 MG capsule TAKE 1 CAPSULE TWICE A DAY (SCHEDULE FOLLOW UP APPOINTMENT) 03/05/15   Penumalli, Vikram R, MD  gemfibrozil  (LOPID ) 600 MG tablet Take 600 mg by mouth daily.    [provider]  HYDROcodone -acetaminophen  (NORCO) 7.5-325 MG tablet Take 1-2 tablets by mouth every 4 (four) hours as needed for severe pain (pain score 7-10). 01/28/18   Lucrezia Sachs, PA-C  vitamin B-12 (CYANOCOBALAMIN ) 1000 MCG tablet Take 1,000 mcg by mouth daily.    [provider]    Allergies: Peanut-containing drug products    Review of Systems  Updated Vital Signs BP 116/73 (BP Location: Left Arm)   Pulse 98   Temp (!) 97.5 F (36.4 C) (Oral)   Resp 18   Ht 6' 4 (1.93 m)   Wt 121.6 kg    SpO2 96%   BMI 32.62 kg/m   Physical Exam Vitals and nursing note reviewed.  Constitutional:      Appearance: He is well-developed.  HENT:     Head: Normocephalic and atraumatic.   Eyes:     Pupils: Pupils are equal, round, and reactive to light.   Neck:     Vascular: No JVD.   Cardiovascular:     Rate and Rhythm: Normal rate and regular rhythm.     Heart sounds: No murmur heard.    No friction rub. No gallop.  Pulmonary:     Effort: No respiratory distress.     Breath sounds: No wheezing.  Abdominal:     General: There is no distension.     Tenderness: There is no abdominal tenderness. There is no guarding or rebound.   Musculoskeletal:        General: Normal range of motion.     Cervical back: Normal range of motion and neck supple.     Comments: Patient has an old appearing puncture wound to the plantar aspect of the right foot.  There is some erythema and edema to the plantar aspect that extends to the dorsal aspect up to about midfoot.  Pulse and motor intact.  Patient has diminished sensation in all nerve distributions to the foot.   Skin:    Coloration: Skin is not pale.     Findings: No rash.   Neurological:     Mental Status: He is alert and oriented to person, place, and time.   Psychiatric:        Behavior: Behavior normal.     (all labs ordered are listed, but only abnormal results are displayed) Labs Reviewed - No data to display  EKG: None  Radiology: DG Foot Complete Right Result Date: 12/12/2023 CLINICAL DATA:  swelling EXAM: RIGHT FOOT COMPLETE - 3+ VIEW COMPARISON:  None Available. FINDINGS: No acute fracture or dislocation. Calcaneal enthesophyte. Scattered midfoot degenerative changes. Small subcortical cysts of the IP joint. No definite area of erosion or osseous destruction. No unexpected radiopaque foreign body. Soft tissue edema. IMPRESSION: Soft tissue edema without acute fracture or dislocation. Electronically Signed   By: Clancy Crimes M.D.   On: 12/12/2023 16:20     Procedures   Medications Ordered in the ED  ciprofloxacin (CIPRO) tablet 500 mg (has no administration in time range)                                    Medical Decision Making Amount and/or Complexity of Data Reviewed Radiology: ordered.  Risk Prescription drug management.   68 yo M with a chief complaints of redness and swelling to his foot.  Patient stepped on a nail a few days ago.  Went through the sole of his shoe.  Will start on oral antibiotics.  PCP follow-up. Plain film of the foot independently interpreted by me without fracture or foreign body.  5:26 PM:  I have discussed the diagnosis/risks/treatment options with the patient and family.  Evaluation and diagnostic testing in the emergency department does not suggest an emergent condition requiring admission or immediate intervention beyond what has been performed at this time.  They will follow up with PCP. We also discussed returning to the ED immediately if new or worsening sx occur. We discussed the sx which are most concerning (e.g., sudden worsening pain, fever, inability to tolerate by mouth) that necessitate immediate return. Medications administered to the patient during their visit and any new prescriptions provided to the patient are listed below.  Medications given during this visit Medications  ciprofloxacin (CIPRO) tablet 500 mg (has no administration in time range)     The patient appears reasonably screen and/or stabilized for discharge and I doubt any other medical condition or other [facility] requiring further screening, evaluation, or treatment in the ED at this time prior to discharge.        Final diagnoses:  Cellulitis of right lower extremity    ED Discharge Orders          Ordered    ciprofloxacin (CIPRO) 500 MG tablet  Every 12 hours        12/12/23 1722               Lilyth Lawyer, DO 12/12/23 1726

## 2023-12-12 NOTE — ED Notes (Signed)
 Discharge instructions reviewed with patient. Patient verbalizes understanding, no further questions at this time. Medications/prescriptions and follow up information provided. No acute distress noted at time of departure.

## 2023-12-12 NOTE — Discharge Instructions (Signed)
 Return for rapid spreading redness or fever.  Follow up with your family doc in the office.
